# Patient Record
Sex: Female | Born: 1935 | Race: White | Hispanic: No | Smoking: Never smoker
Health system: Southern US, Community
[De-identification: ages and names within clinical notes are randomized; demographics above are authoritative.]

## PROBLEM LIST (undated history)

## (undated) DIAGNOSIS — M1990 Unspecified osteoarthritis, unspecified site: Secondary | ICD-10-CM

## (undated) DIAGNOSIS — H543 Unqualified visual loss, both eyes: Secondary | ICD-10-CM

## (undated) DIAGNOSIS — R51 Headache: Secondary | ICD-10-CM

## (undated) DIAGNOSIS — K219 Gastro-esophageal reflux disease without esophagitis: Secondary | ICD-10-CM

## (undated) DIAGNOSIS — G8929 Other chronic pain: Secondary | ICD-10-CM

## (undated) DIAGNOSIS — I10 Essential (primary) hypertension: Secondary | ICD-10-CM

## (undated) DIAGNOSIS — I209 Angina pectoris, unspecified: Secondary | ICD-10-CM

## (undated) DIAGNOSIS — N200 Calculus of kidney: Secondary | ICD-10-CM

## (undated) DIAGNOSIS — I2510 Atherosclerotic heart disease of native coronary artery without angina pectoris: Secondary | ICD-10-CM

## (undated) DIAGNOSIS — M545 Low back pain, unspecified: Secondary | ICD-10-CM

## (undated) DIAGNOSIS — R011 Cardiac murmur, unspecified: Secondary | ICD-10-CM

## (undated) DIAGNOSIS — H02059 Trichiasis without entropian unspecified eye, unspecified eyelid: Secondary | ICD-10-CM

## (undated) DIAGNOSIS — Z8719 Personal history of other diseases of the digestive system: Secondary | ICD-10-CM

## (undated) DIAGNOSIS — E7800 Pure hypercholesterolemia, unspecified: Secondary | ICD-10-CM

## (undated) DIAGNOSIS — R519 Headache, unspecified: Secondary | ICD-10-CM

## (undated) DIAGNOSIS — H9190 Unspecified hearing loss, unspecified ear: Secondary | ICD-10-CM

## (undated) DIAGNOSIS — J189 Pneumonia, unspecified organism: Secondary | ICD-10-CM

## (undated) HISTORY — PX: OTHER SURGICAL HISTORY: SHX169

## (undated) HISTORY — PX: EYE SURGERY: SHX253

## (undated) HISTORY — PX: ENTROPIAN REPAIR: SHX1512

## (undated) HISTORY — PX: CORONARY ANGIOPLASTY WITH STENT PLACEMENT: SHX49

## (undated) HISTORY — PX: DILATION AND CURETTAGE OF UTERUS: SHX78

---

## 1969-07-11 HISTORY — PX: KIDNEY STONE SURGERY: SHX686

## 1998-02-27 ENCOUNTER — Emergency Department (HOSPITAL_COMMUNITY): Admission: EM | Admit: 1998-02-27 | Discharge: 1998-02-27 | Payer: Self-pay | Admitting: Emergency Medicine

## 1998-04-26 ENCOUNTER — Ambulatory Visit (HOSPITAL_COMMUNITY): Admission: RE | Admit: 1998-04-26 | Discharge: 1998-04-26 | Payer: Self-pay | Admitting: Cardiology

## 1998-05-04 ENCOUNTER — Ambulatory Visit (HOSPITAL_COMMUNITY): Admission: RE | Admit: 1998-05-04 | Discharge: 1998-05-04 | Payer: Self-pay | Admitting: Cardiology

## 1998-09-30 ENCOUNTER — Emergency Department (HOSPITAL_COMMUNITY): Admission: EM | Admit: 1998-09-30 | Discharge: 1998-09-30 | Payer: Self-pay | Admitting: Emergency Medicine

## 1999-07-12 HISTORY — PX: EYE SURGERY: SHX253

## 1999-07-12 HISTORY — PX: CATARACT EXTRACTION W/ INTRAOCULAR LENS IMPLANT: SHX1309

## 2000-01-29 ENCOUNTER — Encounter: Payer: Self-pay | Admitting: Internal Medicine

## 2000-01-29 ENCOUNTER — Encounter: Admission: RE | Admit: 2000-01-29 | Discharge: 2000-01-29 | Payer: Self-pay | Admitting: Internal Medicine

## 2002-04-11 ENCOUNTER — Encounter: Payer: Self-pay | Admitting: Cardiology

## 2002-04-11 ENCOUNTER — Inpatient Hospital Stay (HOSPITAL_COMMUNITY): Admission: EM | Admit: 2002-04-11 | Discharge: 2002-04-15 | Payer: Self-pay | Admitting: Emergency Medicine

## 2002-04-11 ENCOUNTER — Encounter: Payer: Self-pay | Admitting: Emergency Medicine

## 2003-11-06 HISTORY — PX: ECTROPION REPAIR: SHX357

## 2004-01-01 HISTORY — PX: ENTROPIAN REPAIR: SHX1512

## 2004-01-07 ENCOUNTER — Emergency Department (HOSPITAL_COMMUNITY): Admission: EM | Admit: 2004-01-07 | Discharge: 2004-01-07 | Payer: Self-pay | Admitting: Emergency Medicine

## 2005-01-19 ENCOUNTER — Emergency Department (HOSPITAL_COMMUNITY): Admission: EM | Admit: 2005-01-19 | Discharge: 2005-01-19 | Payer: Self-pay | Admitting: Emergency Medicine

## 2005-06-17 ENCOUNTER — Inpatient Hospital Stay (HOSPITAL_COMMUNITY): Admission: EM | Admit: 2005-06-17 | Discharge: 2005-06-23 | Payer: Self-pay | Admitting: Emergency Medicine

## 2007-12-27 ENCOUNTER — Ambulatory Visit (HOSPITAL_COMMUNITY): Admission: EM | Admit: 2007-12-27 | Discharge: 2007-12-27 | Payer: Self-pay | Admitting: Emergency Medicine

## 2008-03-09 ENCOUNTER — Encounter: Admission: RE | Admit: 2008-03-09 | Discharge: 2008-03-09 | Payer: Self-pay | Admitting: Cardiology

## 2008-09-12 ENCOUNTER — Emergency Department (HOSPITAL_COMMUNITY): Admission: EM | Admit: 2008-09-12 | Discharge: 2008-09-13 | Payer: Self-pay | Admitting: Emergency Medicine

## 2008-12-18 ENCOUNTER — Encounter: Admission: RE | Admit: 2008-12-18 | Discharge: 2009-01-25 | Payer: Self-pay | Admitting: Internal Medicine

## 2009-10-07 ENCOUNTER — Inpatient Hospital Stay (HOSPITAL_COMMUNITY): Admission: EM | Admit: 2009-10-07 | Discharge: 2009-10-08 | Payer: Self-pay | Admitting: Emergency Medicine

## 2010-12-05 ENCOUNTER — Encounter (HOSPITAL_COMMUNITY)
Admission: RE | Admit: 2010-12-05 | Discharge: 2010-12-10 | Payer: Self-pay | Source: Home / Self Care | Attending: Internal Medicine | Admitting: Internal Medicine

## 2010-12-05 LAB — CBC
HCT: 27 % — ABNORMAL LOW (ref 36.0–46.0)
Hemoglobin: 8.6 g/dL — ABNORMAL LOW (ref 12.0–15.0)
MCH: 25.1 pg — ABNORMAL LOW (ref 26.0–34.0)
MCHC: 31.9 g/dL (ref 30.0–36.0)
RBC: 3.42 MIL/uL — ABNORMAL LOW (ref 3.87–5.11)
WBC: 4.7 10*3/uL (ref 4.0–10.5)

## 2010-12-19 ENCOUNTER — Other Ambulatory Visit: Payer: Self-pay | Admitting: Internal Medicine

## 2010-12-19 ENCOUNTER — Encounter (HOSPITAL_COMMUNITY): Payer: Medicare Other | Attending: Internal Medicine

## 2010-12-19 DIAGNOSIS — D649 Anemia, unspecified: Secondary | ICD-10-CM | POA: Insufficient documentation

## 2010-12-19 LAB — CBC
HCT: 31.3 % — ABNORMAL LOW (ref 36.0–46.0)
MCHC: 31.9 g/dL (ref 30.0–36.0)
Platelets: 270 10*3/uL (ref 150–400)
RBC: 3.65 MIL/uL — ABNORMAL LOW (ref 3.87–5.11)
RDW: 22.6 % — ABNORMAL HIGH (ref 11.5–15.5)

## 2011-01-02 ENCOUNTER — Encounter (HOSPITAL_COMMUNITY): Payer: Medicare Other

## 2011-01-02 ENCOUNTER — Other Ambulatory Visit: Payer: Self-pay | Admitting: Internal Medicine

## 2011-01-02 LAB — CBC
HCT: 33.6 % — ABNORMAL LOW (ref 36.0–46.0)
Hemoglobin: 10.4 g/dL — ABNORMAL LOW (ref 12.0–15.0)
MCH: 27.4 pg (ref 26.0–34.0)

## 2011-02-12 LAB — CBC
HCT: 33.3 % — ABNORMAL LOW (ref 36.0–46.0)
MCHC: 34 g/dL (ref 30.0–36.0)
Platelets: 242 10*3/uL (ref 150–400)

## 2011-02-12 LAB — URINALYSIS, ROUTINE W REFLEX MICROSCOPIC
Glucose, UA: NEGATIVE mg/dL
Hgb urine dipstick: NEGATIVE
Nitrite: NEGATIVE
pH: 6 (ref 5.0–8.0)

## 2011-02-12 LAB — URINE MICROSCOPIC-ADD ON

## 2011-02-12 LAB — BASIC METABOLIC PANEL
Chloride: 100 mEq/L (ref 96–112)
GFR calc Af Amer: 35 mL/min — ABNORMAL LOW (ref >60)
GFR calc non Af Amer: 29 mL/min — ABNORMAL LOW (ref >60)
Glucose, Bld: 123 mg/dL — ABNORMAL HIGH (ref 70–99)
Potassium: 4.4 mEq/L (ref 3.5–5.1)

## 2011-02-12 LAB — DIFFERENTIAL
Basophils Relative: 1 % (ref 0–1)
Eosinophils Absolute: 0.1 10*3/uL (ref 0.0–0.7)
Eosinophils Relative: 1 % (ref 0–5)
Lymphocytes Relative: 14 % (ref 12–46)
Monocytes Absolute: 0.6 10*3/uL (ref 0.1–1.0)
Neutrophils Relative %: 75 % (ref 43–77)

## 2011-03-28 NOTE — H&P (Signed)
NAME:  Victoria Juarez, Victoria Juarez                 ACCOUNT NO.:  [id number]   MEDICAL RECORD NO.:  [id number]          PATIENT TYPE:  INP   LOCATION:  1826                         FACILITY:  [facility]   PHYSICIAN:  Mark A. Perini, M.D.   DATE OF BIRTH:  June 11, 1915   DATE OF ADMISSION:  06/17/2005  DATE OF DISCHARGE:                                HISTORY & PHYSICAL   PRIMARY CARE PHYSICIAN:  Geoffry Paradise, M.D.   CARDIOLOGY CONSULTANT:  Georga Hacking, M.D.   PAIN MANAGEMENT PHYSICIAN:  Richard D. Ethelene Hal, M.D.   CHIEF COMPLAINT:  Chest pain.   HISTORY OF THE PRESENT ILLNESS:  Victoria Juarez is an 77 year old female with a  past medical history as listed below.  She had a fall seven to 10 days ago  with no apparent fracture; however, with increased pain superimposed on her  diffuse severe chronic pain issues.  Due to this she has been immobilized  and has had notable increased lower extremity edema bilaterally.  She has  some degree of chest pain everyday and today she had an episode at around  noon of a marked increase in chest pain radiating through to her back, and  down her shoulders and arms.  She was brought to the emergency room where  she was found to have an unremarkable EKG with no new changes as well as  negative enzymes on several serial checks.  Furthermore, she has had a CT  scan of her chest, which is negative  for pulmonary embolism or aortic  dissection.  However, due to her reduced mobility and continued severe pain  in her back as well as some continued chest pain she will be admitted for  further evaluation and treatment.   PAST MEDICAL HISTORY:  1.  Atherosclerotic coronary disease status post previous LAD stent.  She      had a follow up cardiac catheterization in June 2003 which showed her      stent to be open, normal LV function and otherwise mild atherosclerotic      changes.  2.  Hypertension.  3.  Gastroesophageal reflux.  4.  L spine degenerative joint disease and  severe generalized      osteoarthritis.  5.  Hyperlipidemia, but the patient apparently is not tolerant of      medications.  6.  No history of diabetes or stroke.  7.  Clinical osteoporosis, although she is not on treatment currently.  8.  No history of dementia.  9.  Status post kidney stone removal remotely.  10. G 2, P 2, parity status; all normal vaginal deliveries.   ALLERGIES:  CODEINE, FLEXERIL and TALWIN are not tolerated.   MEDICATIONS:  The patient's medications currently are:  1.  Diovan 160 mg daily.  2.  Cardizem CD 300 mg daily.  3.  Aspirin 500 mg three times a day.  4.  Nitroglycerin as needed, but she uses this once every other month per      the daughter.  5.  Ambien 10 mg each evening.  6.  Meclizine as needed.  7.  Flexeril; was recently placed, although there is  note that she cannot      tolerate this.   SOCIAL HISTORY:  The patient is a widow.  She has two daughters; one named  Britta Mccreedy who is at the bedside.  She has no tobacco or alcohol history.  She  lives at [facility] in an independent apartment for the last two years.  Up until the last month she was rather functional per the daughter.   FAMILY HISTORY:  Father died at age 47 of stomach cancer.  Mother died at  age 40 of an MI.   REVIEW OF SYSTEMS:  Victoria Juarez is a Full Code Status.  She had a bad fall  10 days ago roughly.  She did have x-rays, which showed no new fractures.  She had some contusions and bruising, and had marked increase in her overall  pain level, which usual is quite high.  She has had very swollen legs for  the last five days and has been doing much more sitting as opposed to her  normal amount of activity.  She refuses to use a walker.  She denies any  recent fevers.  She has severe chronic constipation issues and her last  bowel movement was three to four days ago.  She did try to use an enema  today and just had some bloody discharge.  She does not normally have blood   from her rectum.  She denies any blood from any other site at this time.  She did have some shortness of breath today noted.  She denies any new  abdominal pain, but she does have some chronic lower abdominal pain.   PHYSICAL EXAMINATION:  VITAL SIGNS:  Temperature 97.8, blood pressure  143/56, pulse 84, respiratory rate 20, and 96% saturation on 2 liters of  oxygen.  GENERAL APPEARANCE:  The patient has 1-2+ nonpitting lower extremity edema  up to the tibia with some venous stasis skin changes noted.  She is  neurologically grossly intact.  She alert and oriented times four.  She is  semisupine in bed and she is no acute distress.  HEAD AND NECK:  There is no JVD.  No icterus.  No pallor.  LUNGS:  The patient has some slight inspiratory crackles throughout both  lung fields on inspirations with no wheezes or rhonchi.  HEART:  The heart is regular rate and rhythm with no significant murmur, rub  or gallop.  ABDOMEN:  The abdomen is soft and nontender with no masses.  She does have  left lower quadrant ventral abdominal wall hernia, which is easily  reducible.  EXTREMITIES:  The patient moves extremities times four.   LABORATORY DATA:  CK/MB, troponin I and myoglobin are essentially normal  times two sets.  Her myoglobin on the second check was 238.  Her white count  is 9.9 with a normal differential, hemoglobin 12.7 and platelet count  215,000.  Sodium 137, potassium 3.4, chloride 104, CO2 23, BUN 19,  creatinine 0.9, and glucose 168.  The pH was 7.419, pCO2 37, bicarb 23 and D-  dimer is elevated at 3.96.  EKG shows normal sinus rhythm with left axis  deviation, premature atrial complex, contraction, inferolateral ST  depression, which is unchanged compared to 2003.  CT of the chest shows no  PE and no aortic dissection.  She does have significant aortic and  atherosclerotic calcification  of the coronary arteries as well as the  aorta.  ASSESSMENT AND  PLAN:  [age]-year-old  female with failure to thrive  and increased pain superimposed on chronic pain in her back as well as an  episode of increased chest pain superimposed on some chronic chest pains.  There is no clear cause at this time; however, the patient's functional  status has clearly declined to the point where she is not mobile.   1.  We will admit her.  2.  We will continue IV access.  3.  We will rule out for myocardial infarction with serial enzymes and      electrocardiograms.  4.  We will obtain a physical therapy and occupational therapy consultation.  5.  The patient may have to move up to assisted living or a brief stay in a      skilled nursing facility after this admission.  6.  We will recheck an L spine x-ray to rule out any new compression      fracture; and, in fact, she may need an MRI if the pain proves to be to      severe.  7.  We will and a proton pump inhibitor for GI protection as the patient      takes large doses of aspirin normally.  8.  We will also placed her on DVT dose Lovenox for DVT prophylaxis.  9.  The patient is a Full Code Status.       MAP/MEDQ  D:  06/17/2005  T:  06/17/2005  Job:  [id number]   cc:   Geoffry Paradise, M.D.  [address]  [place]  [place] [address]  Fax: [phone]   W. Ashley Royalty., M.D.  [address], [address]  [place]  [place] [address]  Fax: [phone]   Caralyn Guile. Ethelene Hal, M.D.  [address]  [place]  [place] [address]  Fax: [phone]
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
## 2011-03-28 NOTE — Consult Note (Signed)
Victoria Juarez, Victoria Juarez                 ACCOUNT NO.:  [id number]   MEDICAL RECORD NO.:  [id number]          PATIENT TYPE:  EMS   LOCATION:  MAJO                         FACILITY:  [facility]   PHYSICIAN:  Georga Hacking, M.D.DATE OF BIRTH:  [birth year]   DATE OF CONSULTATION:  06/17/2005  DATE OF DISCHARGE:                                   CONSULTATION   This 77 year old female was seen in consultation for evaluation of chest  pain.  The patient has a prior history of coronary artery disease with  intermittent pain all through the years.  In November of 1998, she had  recurrent angina, had a NIR stent placed in the mid LAD.  She has continued  to have atypical mid sternal chest pain, bilateral arm pain, arthritic pain  and neck pain since that time but had been able to live alone.  She has been  very difficult to evaluate over the years.  She was hospitalized in June of  2003 with chest pain and had cardiac catheterization at that time showing  40% right coronary artery stenosis, 40-50% LAD stenosis,  widely patent  stent involving the LAD with moderate disease in the mid vessel.  She has  been able to live alone at that time but has been anxious because of the  area she lives in.  Her daughter lives in [place].  She was hospitalized  last summer in [place] with dizziness and an elevated blood pressure spike  and had a fairly involved workup and eventually wore an event monitor that  did not show any severe arrhythmias.  She evidently fell and has been in bed  for the past 2 weeks and has complained of some swollen ankles, pain  involving her leg and severe back pain.  She was taken to see the back  doctor at some point, but has basically been in bed and unable to be up and  about.  Several days ago she had the onset of some sharp chest discomfort in  her mid chest area accompanied with sweating that resolved.  Today she again  had sharp pain accompanied with shortness of breath and sweating  in her mid  chest that radiated somewhat into her back and somewhat into her arms.  It  would occur at some point on the left side, some point on the right side.  She was brought to the Emergency Room where an EKG was unremarkable and  initial cardiac enzymes were negative, a D-dimer was 3.96 which is elevated.  She has some mild shortness of breath at the present time.   PAST HISTORY:  Is remarkable for a longstanding hypertension.  She has a  previous history of iritis.  There is no history of ulcers or diabetes.  She  has had hyperlipidemia in the past but has had difficulty tolerating  cholesterol type medicines in the past.   PREVIOUS SURGERY:  1.  D&C.  2.  Knee surgery.   ALLERGIES:  TO SHRIMP.   She previously has been on aspirin 325 mg daily and eye drops previously.   FAMILY  HISTORY:  Is recorded in old records from June of 2003.   SOCIAL HISTORY:  She is currently a widow and lives alone.  A daughter lives  in the [place] area, but is in the process of moving back to [place] and  was in attendance today.  She is a nonsmoker, does not use alcohol to excess  and is a widow.   REVIEW OF SYSTEMS:  Her weight has been stable.  She has had significant  chronic medical problems.  She has severe pain involving her legs, knees,  episodic stomach pain.  She has constipation that has been a problem for  her.  She complains of numbness involving both legs bilaterally and a  feeling as if her legs are having trouble moving.  She has severe  osteoarthritis and also atypical left arm symptoms present for years.  No  genitourinary symptoms or problems, no history of stroke or TIA.  Other than  as noted above, the remainder of the review of systems is unremarkable.   On examination, she is an elderly woman who is difficult to evaluate.  Her  blood pressure was 131/61, temperature 97.8, pulse was 82, pulse ox was 96%.  ENT:  EOMI, PERLA, CNS clear, fundi not examined, pharynx  negative.  NECK:  Supple without masses, JVD, thyromegaly or bruits.  LUNGS:  Clear bilaterally.  CARDIAC EXAM:  Showed normal S1 and S2.  There was no S3.  ABDOMEN:  Is soft and nontender.  There is tenderness in her chest and there  is tenderness also involving her right leg on motion of the leg.  There is  2+ peripheral edema noted, peripheral pulses are 3+.   A 12-lead EKG shows nonspecific ST and T wave abnormality.   LABORATORY WORK:  Shows a hemoglobin of 12.7, hematocrit 36.3, a white count  of 9900.  Initial cardiac enzymes and point of care enzymes were negative.  ISTAT shows a potassium of 3.4, sodium of 137, chloride of 104.   She is currently on Diovan, Cardizem, aspirin and nitroglycerin.   IMPRESSION:  1.  Chest pain with some atypical features in a patient with longstanding      chest pain.  2.  Coronary artery disease with previous stent to the left anterior      descending, previous moderate three vessel disease with catheterization      in 2003.  3.  Recent fall with elevated D-dimer, rule out pulmonary embolus.  4.  History of dizziness.  5.  Severe arthritis.  6.  Previous history of back pain.  7.  Hypertension.   RECOMMENDATIONS:  The patient does not have an acute MI by EKG.  Initial  cardiac enzymes are negative and her EKG is normal.  I would recommend she  have a CT scan of her chest, obtain serial cardiac enzymes and continue her  medicines at home, anticoagulate after CT scan of chest is done.      Georga Hacking, M.D.  Electronically Signed     WST/MEDQ  D:  06/17/2005  T:  06/17/2005  Job:  [id number]   cc:   Geoffry Paradise, M.D.  [address]  [place]  [place] [address]  Fax: [phone]

## 2011-03-28 NOTE — Cardiovascular Report (Signed)
[place]. [facility]  Patient:    Victoria Juarez, Victoria Juarez Visit Number: [id number] MRN: [id number]          Service Type: MED Location: 3700 3736 01 Attending Physician:  Norman Clay Dictated by:   Darden Palmer., M.D. Proc. Date: 04/13/02 Admit Date:  04/11/2002 Discharge Date: 04/15/2002   CC:         Richard A. Jacky Kindle, M.D.   Cardiac Catheterization  HISTORY:  An 77 year old female with a longstanding history of atypical chest pain who has a known history of coronary disease with stenting of the LAD 5 years ago.  She presented with chest discomfort with some typical and other atypical features and developed ST depression on EKG that was new.  MI was ruled out.  COMMENTS ABOUT PROCEDURE:  The patient tolerated the procedure well without complications and had good hemostasis present at the end of the procedure. Please see the attached catheterization log for the remainder of the details.  HEMODYNAMIC DATA: 1. Aorta post contrast 129/56. 2. LV post contrast 129/9.  ANGIOGRAPHIC DATA:  LEFT VENTRICULOGRAM:  Performed in the 30-degree RAO projection.  The aortic valve appeared normal.  There is moderate mitral annular calcification noted without significant mitral regurgitation.  The left ventricle is normal in size with normal wall motion.  There is normal contractility.  The estimated ejection fraction is 70-80%.  CORONARY ARTERIOGRAPHY:  Coronary arteries arise and distribute normally. There is moderate calcification noted in the right coronary artery and the left coronary artery. 1. The left main coronary artery appears normal.  2. The left anterior descending artery has a 40% proximal stenosis.  There is    an eccentric 40% stenosis prior to the septal perforator.  The previously    stented site is widely patent.  This is followed by an area of moderate    diffuse disease with 30-40% irregularity noted.  3. The circumflex  coronary artery is an enlarged codominant vessel.  A large    first marginal branch arises.  A series of two posterolateral branches    arise and then a large third posterolateral branch is present.  There are    only minimal irregularities in this vessel.  4. The right coronary artery has heavy calcification of the ostium.  There is    a segmental 40% stenosis near the ostium and another 40% stenosis with    irregularities noted.  IMPRESSION: 1. Moderate coronary artery disease predominantly involving the left anterior    descending artery and the right coronary artery.  No severe focal    obstructive stenoses noted. 2. Normal left ventricular function.  RECOMMENDATIONS:  Continued medical therapy. Dictated by:   Darden Palmer., M.D. Attending Physician:  Norman Clay DD:  04/14/02 TD:  04/17/02 Job: [id number]

## 2011-03-28 NOTE — Discharge Summary (Signed)
[place]. [facility]  Patient:    Victoria Juarez, Victoria Juarez Visit Number: [id number] MRN: [id number]          Service Type: MED Location: [id number] Attending Physician:  Norman Clay Dictated by:   Darden Palmer., M.D. Admit Date:  04/11/2002 Discharge Date: 04/15/2002   CC:         Richard A. Jacky Kindle, M.D.   Discharge Summary  FINAL DIAGNOSES: 1. Chest pain of undetermined etiology, myocardial infarction ruled out.    a. Catheterization showing moderate coronary artery disease.  No severe       focal obstructive stenoses noted.  Pain is atypical for angina. 2. Severe generalized arthritis. 3. Anxiety. 4. Hypertensive heart disease. 5. Dyslipidemia not currently being treated.  PROCEDURES:  Cardiac catheterization, CT scan of chest.  HISTORY:  An 77 year old woman admitted to rule out MI and chest pain. Long-standing history of intermittent chest pain over the years.  She had coronary disease diagnosed November 1991.  In November 1998 had recurrent angina and had a 3.0 x 16 mm Nir stent placed in the mid LAD.  She headache periodically had episodic chest pain since that time.  She has also had a typical midsternal chest pain, bilateral arm pain, arthritic pain, and neck pain.  She, however, has been able to live alone.  She is continuing to complain of daily chest and arm pains, however.  She was in her usual state of health, but awoke the morning of admission with a left-sided sharp chest pain different than previous pain she had when she had angina and different from the previous pain when she had stenting.  The pain abated.  The daughter came up, took her out to visit the husbands grave, put flowers on there and she had recurrence of the left sharp pain which was severe and almost took her to her knees.  It was described as sharp and pulsating.  She took two nitroglycerin, noted the pain pulsated and went away after 20 minutes.  It  was uncertain whether there was any immediate relief from nitroglycerin.  She was admitted to rule out an MI.  Please see the previously dictated history and physical for remainder of the details.  HOSPITAL COURSE:  Laboratory data on admission showed a hemoglobin of 14.3, hematocrit 41.2.  Glucose is 144 on a random sample.  Potassium 3.6.  All CPK and troponins were completely normal.  A CT scan of the chest done showed no pulmonary emboli or aortic dissection.  The heart was felt to be mildly enlarged.  There was basically atelectasis.  There is thoracic aortic atherosclerosis of without aneurysm or dissection.  There were no filling defects in the lower extremity.  Serial ECGs showed the evolution of T-wave changes in the anterolateral leads.  She remained very difficult to evaluate in the hospital and continued to complain of chest discomfort which was left-sided and sometimes associated with tenderness.  Because of the new EKG changes and ongoing nature of the chest pain, she was taken to cardiac catheterization laboratory on April 14, 2002.  The left main was normal.  The circumflex had no significant disease.  Right coronary artery 40% stenosis. LAD had a 40-50% proximal stenosis.  The stent was widely patent.  There was a 60% mid vessel stenosis noted.  Her symptoms were not felt to be explained by the presence of coronary disease in her and she continued to complain of atypical chest pain.  It was recommended that she be discharged home, that she be treated for arthritis and other noncardiac causes for pain.  She is discharged in improved continued to continue her Cardizem 325 mg daily, aspirin daily, and eye drops.  She also is to begin Altace 5 mg daily.  We will see her in follow-up in one week and she is to follow up with Dr. Jacky Kindle medically. Dictated by:   Darden Palmer., M.D. Attending Physician:  Norman Clay DD:  04/15/02 TD:  04/18/02 Job:  [id number]

## 2011-03-28 NOTE — Discharge Summary (Signed)
NAME:  Victoria Juarez, Victoria Juarez                 ACCOUNT NO.:  [id number]   MEDICAL RECORD NO.:  [id number]          PATIENT TYPE:  INP   LOCATION:  4715                         FACILITY:  [facility]   PHYSICIAN:  Mark A. Perini, M.D.   DATE OF BIRTH:  Oct 10, 1915   DATE OF ADMISSION:  06/17/2005  DATE OF DISCHARGE:  06/23/2005                                 DISCHARGE SUMMARY   DISCHARGE DIAGNOSES:  1.  Atherosclerotic coronary disease, status post non-Q-wave myocardial      infarction with subsequent percutaneous coronary intervention and stent      to the left anterior descending artery.  2.  Degenerative joint disease and degenerative disk disease of the back.  3.  Previous history of coronary artery disease with previous left anterior      descending  stent.  4.  Hypertension.  5.  Gastroesophageal reflux.  6.  Hyperlipidemia with intolerance of many medications.  7.  Osteoporosis clinically  8.  Status post remote kidney stone removal.   PROCEDURES:  Cardiology consultation and subsequent cardiac catheterization  with the findings of a mild narrowing of the left main coronary artery, an  ostial 40% lesion of the left anterior descending, and an eccentric 85-90%  proximal LAD lesion and a distal 40-50% LAD stenosis. She had a large left  circumflex vessel with a 30% stenosis and right coronary had a proximal 40-  50% stenosis. She subsequently had a stent placed to the LAD.   DISCHARGE MEDICATIONS:  1.  Thiamine 160 mg once daily.  2.  Cardizem CD 3 mg daily.  3.  Aspirin 81 mg daily.  4.  Nitroglycerin as needed.  5.  Ambien 10 mg each evening as needed.  6.  Plavix 75 mg once daily, the length of treatment to be determined by Dr.      Donnie Aho.  7.  Prilosec OTC 20 mg once daily with food.  8.  Xanax 0.25 mg once up to twice daily as needed for nervousness.  9.  Metoprolol 25 mg twice daily.  10. Tylenol 500 mg 1-2 tablets every eight hours as needed.   HISTORY OF PRESENT ILLNESS:  Ms.  Juarez is an 77 year old female with a past  history significant for coronary disease, hypertension, gastroesophageal  reflux, lumbar spine degenerative joint disease and generalized  osteoarthritis as well. She had a fall 7-10 days prior to admission with no  apparent fracture. However, she had increased pain, superimposed on her  diffuse severe chronic pain issues. She had been somewhat immobilized and  developed increased chest pain over the background of her normal every day  chest pain. This new pain was markedly more intense and radiated through to  her back and down her shoulders and arms. She was brought to the emergency  room.  She was found have an unremarkable EKG with no new changes and  negative cardiac enzymes on several serial checks in the emergency room.  Furthermore, a CT scan of her chest showed no evidence of pulmonary embolism  or aortic dissection. However, due to her severe pain and reduced  mobility,  she was admitted for further evaluation.   HOSPITAL COURSE:  Victoria Juarez was admitted to a telemetry bed. Subsequent  cardiac enzymes increased and showed that she had ruled in for a non-Q-wave  myocardial infarction.  Cardiology was consulted, and she subsequently  underwent cardiac catheterization with results as noted above. She tolerated  this very well. She did have a physical therapy and occupational therapy  evaluation, and it was felt that she would be appropriate for returning to  her independent apartment with outpatient occupational therapy and physical  therapy consults. By June 23, 2005, she was deemed stable for discharge  home.   DISCHARGE PHYSICAL EXAMINATION:  VITAL SIGNS:  Temperature 98.4, afebrile,  blood pressure 112/61, pulse 67, RESPIRATORY RATE 20, 96% saturation on room  air.  GENERAL:  She is in no acute distress, standing in the room.  LUNGS:  Clear to auscultation bilaterally.  HEART:  Regular rate and rhythm with a 2/6 murmur at the  left sternal border  in systole  ABDOMEN:  Soft and  nontender.  EXTREMITIES:  There was no significant lower extremity edema.   NOTABLE LABORATORY DATA:  On June 21, 2005, her sodium was 137, potassium  3.6, chloride 109, CO2 22, glucose 103, BUN 14, creatinine 0.8, calcium 8.  On June 18, 1005, the patient's peak CK enzyme was 245 with a peak MB  fraction of 28.9 and a peak troponin of 4.98.  On  June 18, 2005, pea,k  myoglobin was 238.  On June 21, 2005, white count was 6.4, hemoglobin  11.5, platelet count 220,000, and MCV was 96.7.   DISCHARGE INSTRUCTIONS:  Victoria Juarez is to follow a low-fat, low-salt diet.  She is to increase her activity slowly and is to use her walker or cane as  directed by physical therapy. She is to follow up with Dr. Donnie Aho as  directed, and she is to follow up with Dr. Jacky Kindle in two weeks.  She is to  call with any recurrent problems, and she is also to wear compression  stockings on her legs each day while awake.           ______________________________  Redge Gainer. Perini, M.D.     MAP/MEDQ  D:  07/23/2005  T:  07/23/2005  Job:  [id number]   cc:   Georga Hacking, M.D.  [address], [address]  [place]  [place] [address]  Fax: [phone]   Geoffry Paradise, M.D.  [address]  [place]  [place] [address]  Fax: [phone]   Caralyn Guile. Ethelene Hal, M.D.  [address]  [place]  [place] [address]  Fax: [phone]

## 2011-03-28 NOTE — Cardiovascular Report (Signed)
Victoria Juarez, Victoria Juarez                 ACCOUNT NO.:  [id number]   MEDICAL RECORD NO.:  [id number]          PATIENT TYPE:  INP   LOCATION:  6532                         FACILITY:  [facility]   PHYSICIAN:  Georga Hacking, M.D.DATE OF BIRTH:  06/22/1915   DATE OF PROCEDURE:  06/19/2005  DATE OF DISCHARGE:                              CARDIAC CATHETERIZATION   HISTORY:  An 77 year old female presented with prolonged chest pain  consistent with unstable angina and she had elevated CPKs.  Developed T-wave  inversions in the anterior leads.  Catheterization is done to assess risk.   COMMENTS ABOUT PROCEDURE:  The patient was prepped and draped in the usual  manner.  After Xylocaine anesthesia a 6-French sheath was placed in the  femoral artery percutaneously without difficulty on a single anterior needle  wall stick.  Angiograms were made using 6-French catheters.  A 30 mL  ventriculogram was performed.  Following this arrangements were made to do  stenting of the proximal LAD stenosis.  Angiomax was begun achieving an ACT  of greater than 200.  She was administered 3 mg of Plavix.  Angioplasty used  was a 6-French JR3.5 guiding catheter and an HTFJ guidewire that crossed the  lesion easily.  The lesion was pre dilated with a 2.5 x 12 mm Maverick  balloon.  A 3 x 12 mm TAXUS drug-eluting stent was deployed to 12  atmospheres on two balloon inflations in the proximal LAD with an excellent  angiographic result.  She had significant EKG changes during balloon  inflation.  Post dilatation angiograms showed TIMI grade 3 flow and an  excellent angiographic result.  She continued to have some residual  discomfort that was difficult to evaluate, but had no EKG changes and was  watched on the table for an additional 5-10 minutes with good flow and no  difficulty.  She was administered 25 mg of Benadryl at the end of the  procedure for shaking.  She otherwise tolerated the procedure well and the  sheath  was sutured in place and she was returned to the angioplasty care  center in stable condition.   HEMODYNAMIC DATA:  Aorta post contrast 160/64.  LV post contrast 160/13-22.   ANGIOGRAPHIC DATA:  Left ventriculogram:  Performed in the 30 degree RAO  projection.  The aortic valve is normal.  The mitral valve is normal.  The  left ventricle appears normal in size.  Increased trabeculations are noted.  The estimated ejection fraction is 60%.  Coronary arteries arise and  distribute normally.  Calcifications noted involving the proximal coronary  system.  The left main coronary artery has a mild ostial narrowing noted.  Left anterior descending has a 40% ostial LAD stenosis.  There is an  eccentric 85-90% proximal stenosis involving the LAD prior to the previous  stent.  There is diffuse disease involving the mid portion with segmental 30-  50% stenosis in the mid portion in several locations.  The mid vessel is  somewhat small.  The circumflex coronary artery is a large vessel with 30%  narrowing after the first marginal  branch.  There are minimal irregularities  in the vessel otherwise.  The right coronary artery has a proximal 40-50%  stenosis and is a codominant vessel.  Post dilatation angiograms of the LAD  reveal 0% residual stenosis with preservation of two large septal  perforators.  There is moderate disease present still distally that reversed  after nitroglycerin.   IMPRESSION:  1.  Successful stenting of the left anterior descending with stenosis going      from 85-90% to 0%.  2.  Residual disease of moderate severity involving the mid to distal left      anterior descending and the proximal      right coronary artery.  3.  Normal left ventricular function.   RECOMMENDATIONS:  Continue Integrilin.  Continue Plavix.  Follow up CPK and  EKG.      Georga Hacking, M.D.  Electronically Signed     WST/MEDQ  D:  06/19/2005  T:  06/19/2005  Job:  [id number]   cc:   Geoffry Paradise, M.D.  [address]  [place]  [place] [address]  Fax: [phone]

## 2011-08-12 LAB — POCT I-STAT, CHEM 8
BUN: 23
Chloride: 104
Hemoglobin: 13.3
Potassium: 4.3
Sodium: 137
TCO2: 25

## 2011-08-12 LAB — DIFFERENTIAL
Lymphocytes Relative: 20
Monocytes Absolute: 0.7
Monocytes Relative: 13 — ABNORMAL HIGH
Neutro Abs: 3.7
Neutrophils Relative %: 65

## 2011-08-12 LAB — CBC: HCT: 38

## 2011-08-12 LAB — POCT CARDIAC MARKERS: CKMB, poc: <1 — ABNORMAL LOW

## 2011-08-12 LAB — D-DIMER, QUANTITATIVE: D-Dimer, Quant: 0.89 — ABNORMAL HIGH

## 2013-02-19 ENCOUNTER — Encounter (HOSPITAL_COMMUNITY): Payer: Self-pay | Admitting: Emergency Medicine

## 2013-02-19 ENCOUNTER — Inpatient Hospital Stay (HOSPITAL_COMMUNITY): Payer: Medicare Other

## 2013-02-19 ENCOUNTER — Inpatient Hospital Stay (HOSPITAL_COMMUNITY)
Admission: EM | Admit: 2013-02-19 | Discharge: 2013-02-21 | DRG: 185 | Disposition: A | Discharge status: Skilled Nursing Facility | Payer: Medicare Other | Attending: Internal Medicine | Admitting: Internal Medicine

## 2013-02-19 ENCOUNTER — Emergency Department (HOSPITAL_COMMUNITY): Payer: Medicare Other

## 2013-02-19 DIAGNOSIS — K219 Gastro-esophageal reflux disease without esophagitis: Secondary | ICD-10-CM | POA: Diagnosis present

## 2013-02-19 DIAGNOSIS — I8310 Varicose veins of unspecified lower extremity with inflammation: Secondary | ICD-10-CM | POA: Diagnosis present

## 2013-02-19 DIAGNOSIS — Z66 Do not resuscitate: Secondary | ICD-10-CM | POA: Diagnosis present

## 2013-02-19 DIAGNOSIS — I10 Essential (primary) hypertension: Secondary | ICD-10-CM | POA: Diagnosis present

## 2013-02-19 DIAGNOSIS — S2249XA Multiple fractures of ribs, unspecified side, initial encounter for closed fracture: Principal | ICD-10-CM | POA: Diagnosis present

## 2013-02-19 DIAGNOSIS — H9190 Unspecified hearing loss, unspecified ear: Secondary | ICD-10-CM | POA: Diagnosis present

## 2013-02-19 DIAGNOSIS — I872 Venous insufficiency (chronic) (peripheral): Secondary | ICD-10-CM | POA: Diagnosis present

## 2013-02-19 DIAGNOSIS — W010XXA Fall on same level from slipping, tripping and stumbling without subsequent striking against object, initial encounter: Secondary | ICD-10-CM | POA: Diagnosis present

## 2013-02-19 DIAGNOSIS — S2241XA Multiple fractures of ribs, right side, initial encounter for closed fracture: Secondary | ICD-10-CM

## 2013-02-19 DIAGNOSIS — M25569 Pain in unspecified knee: Secondary | ICD-10-CM | POA: Diagnosis present

## 2013-02-19 DIAGNOSIS — S2231XA Fracture of one rib, right side, initial encounter for closed fracture: Secondary | ICD-10-CM

## 2013-02-19 DIAGNOSIS — Y9210 Unspecified residential institution as the place of occurrence of the external cause: Secondary | ICD-10-CM | POA: Diagnosis present

## 2013-02-19 DIAGNOSIS — F419 Anxiety disorder, unspecified: Secondary | ICD-10-CM | POA: Diagnosis present

## 2013-02-19 DIAGNOSIS — I2510 Atherosclerotic heart disease of native coronary artery without angina pectoris: Secondary | ICD-10-CM | POA: Diagnosis present

## 2013-02-19 DIAGNOSIS — F411 Generalized anxiety disorder: Secondary | ICD-10-CM | POA: Diagnosis present

## 2013-02-19 HISTORY — DX: Unspecified hearing loss, unspecified ear: H91.90

## 2013-02-19 HISTORY — DX: Essential (primary) hypertension: I10

## 2013-02-19 HISTORY — DX: Unspecified osteoarthritis, unspecified site: M19.90

## 2013-02-19 LAB — CBC WITH DIFFERENTIAL/PLATELET
Basophils Absolute: 0 10*3/uL (ref 0.0–0.1)
HCT: 38.7 % (ref 36.0–46.0)
Hemoglobin: 12.9 g/dL (ref 12.0–15.0)
Lymphocytes Relative: 12 % (ref 12–46)
Lymphs Abs: 0.8 10*3/uL (ref 0.7–4.0)
Monocytes Absolute: 0.8 10*3/uL (ref 0.1–1.0)
Monocytes Relative: 13 % — ABNORMAL HIGH (ref 3–12)
Neutro Abs: 4.6 10*3/uL (ref 1.7–7.7)
RBC: 4.23 MIL/uL (ref 3.87–5.11)
RDW: 13.8 % (ref 11.5–15.5)
WBC: 6.4 10*3/uL (ref 4.0–10.5)

## 2013-02-19 LAB — BASIC METABOLIC PANEL
CO2: 28 mEq/L (ref 19–32)
Chloride: 98 mEq/L (ref 96–112)
Creatinine, Ser: 1 mg/dL (ref 0.50–1.10)
Glucose, Bld: 98 mg/dL (ref 70–99)

## 2013-02-19 MED ORDER — TRIAMTERENE-HCTZ 37.5-25 MG PO TABS
1.0000 | ORAL_TABLET | Freq: Every day | ORAL | Status: DC
  Administered 2013-02-19 – 2013-02-21 (×3): 1 via ORAL
  Filled 2013-02-19 (×3): qty 1

## 2013-02-19 MED ORDER — REFRESH P.M. OP OINT: 1.0000 "application " | TOPICAL_OINTMENT | Freq: Every day | OPHTHALMIC | Status: DC

## 2013-02-19 MED ORDER — IRBESARTAN 150 MG PO TABS
150.0000 mg | ORAL_TABLET | Freq: Every day | ORAL | Status: DC
  Administered 2013-02-19 – 2013-02-21 (×3): 150 mg via ORAL
  Filled 2013-02-19 (×3): qty 1

## 2013-02-19 MED ORDER — TRAMADOL HCL 50 MG PO TABS
50.0000 mg | ORAL_TABLET | Freq: Four times a day (QID) | ORAL | Status: DC | PRN
  Administered 2013-02-19 – 2013-02-21 (×7): 50 mg via ORAL
  Filled 2013-02-19 (×7): qty 1

## 2013-02-19 MED ORDER — SODIUM CHLORIDE 0.9 % IJ SOLN
3.0000 mL | Freq: Two times a day (BID) | INTRAMUSCULAR | Status: DC
  Administered 2013-02-19 – 2013-02-20 (×4): 3 mL via INTRAVENOUS

## 2013-02-19 MED ORDER — DILTIAZEM HCL ER BEADS 300 MG PO CP24
300.0000 mg | ORAL_CAPSULE | Freq: Every day | ORAL | Status: DC
  Administered 2013-02-19 – 2013-02-21 (×3): 300 mg via ORAL
  Filled 2013-02-19 (×3): qty 1

## 2013-02-19 MED ORDER — CALCITONIN (SALMON) 200 UNIT/ACT NA SOLN
1.0000 | Freq: Every day | NASAL | Status: DC
  Administered 2013-02-19 – 2013-02-21 (×3): 1 via NASAL
  Filled 2013-02-19: qty 3.7

## 2013-02-19 MED ORDER — ARTIFICIAL TEARS OP OINT
TOPICAL_OINTMENT | Freq: Every day | OPHTHALMIC | Status: DC
  Administered 2013-02-19 – 2013-02-20 (×2): via OPHTHALMIC
  Filled 2013-02-19: qty 3.5

## 2013-02-19 MED ORDER — SODIUM CHLORIDE 0.9 % IV SOLN: INTRAVENOUS | Status: DC

## 2013-02-19 MED ORDER — POLYVINYL ALCOHOL 1.4 % OP SOLN
1.0000 [drp] | Freq: Four times a day (QID) | OPHTHALMIC | Status: DC | PRN
  Filled 2013-02-19: qty 15

## 2013-02-19 MED ORDER — FENTANYL CITRATE 0.05 MG/ML IJ SOLN
50.0000 ug | INTRAMUSCULAR | Status: DC | PRN
  Administered 2013-02-19: 50 ug via INTRAVENOUS
  Filled 2013-02-19: qty 2

## 2013-02-19 MED ORDER — ASPIRIN EC 81 MG PO TBEC
81.0000 mg | DELAYED_RELEASE_TABLET | Freq: Every day | ORAL | Status: DC
  Administered 2013-02-19 – 2013-02-21 (×3): 81 mg via ORAL
  Filled 2013-02-19 (×3): qty 1

## 2013-02-19 MED ORDER — ENOXAPARIN SODIUM 30 MG/0.3ML ~~LOC~~ SOLN
30.0000 mg | SUBCUTANEOUS | Status: DC
  Administered 2013-02-19 – 2013-02-21 (×3): 30 mg via SUBCUTANEOUS
  Filled 2013-02-19 (×3): qty 0.3

## 2013-02-19 MED ORDER — CARISOPRODOL 350 MG PO TABS
350.0000 mg | ORAL_TABLET | Freq: Three times a day (TID) | ORAL | Status: DC | PRN
  Administered 2013-02-20 – 2013-02-21 (×3): 350 mg via ORAL
  Filled 2013-02-19 (×3): qty 1

## 2013-02-19 MED ORDER — ISOSORBIDE MONONITRATE ER 30 MG PO TB24
30.0000 mg | ORAL_TABLET | Freq: Every day | ORAL | Status: DC
  Administered 2013-02-19 – 2013-02-21 (×3): 30 mg via ORAL
  Filled 2013-02-19 (×3): qty 1

## 2013-02-19 MED ORDER — PANTOPRAZOLE SODIUM 40 MG PO TBEC
40.0000 mg | DELAYED_RELEASE_TABLET | Freq: Every day | ORAL | Status: DC
  Administered 2013-02-19 – 2013-02-21 (×3): 40 mg via ORAL
  Filled 2013-02-19 (×3): qty 1

## 2013-02-19 MED ORDER — ALPRAZOLAM 0.25 MG PO TABS: 0.2500 mg | ORAL_TABLET | Freq: Every day | ORAL | Status: DC | PRN

## 2013-02-19 MED ORDER — POLYVINYL ALCOHOL 1.4 % OP SOLN
1.0000 [drp] | Freq: Four times a day (QID) | OPHTHALMIC | Status: DC
  Administered 2013-02-19 – 2013-02-21 (×6): 1 [drp] via OPHTHALMIC
  Filled 2013-02-19: qty 15

## 2013-02-19 MED ORDER — SODIUM CHLORIDE 0.9 % IV SOLN
Freq: Once | INTRAVENOUS | Status: AC
  Administered 2013-02-19: 20 mL/h via INTRAVENOUS

## 2013-02-19 MED ORDER — PREDNISOLONE ACETATE 1 % OP SUSP
1.0000 [drp] | Freq: Every day | OPHTHALMIC | Status: DC
  Administered 2013-02-19 – 2013-02-21 (×3): 1 [drp] via OPHTHALMIC
  Filled 2013-02-19: qty 1

## 2013-02-19 MED ORDER — HYPROMELLOSE (GONIOSCOPIC) 2.5 % OP SOLN: 1.0000 [drp] | Freq: Four times a day (QID) | OPHTHALMIC | Status: DC | PRN

## 2013-02-19 MED ORDER — POLYETHYLENE GLYCOL 3350 17 G PO PACK
17.0000 g | PACK | Freq: Every day | ORAL | Status: DC
  Administered 2013-02-19 – 2013-02-21 (×3): 17 g via ORAL

## 2013-02-19 MED ORDER — ENOXAPARIN SODIUM 40 MG/0.4ML ~~LOC~~ SOLN: 40.0000 mg | SUBCUTANEOUS | Status: DC

## 2013-02-19 NOTE — ED Provider Notes (Signed)
History     CSN: [id number]  Arrival date & time 02/19/13  0541   First MD Initiated Contact with Patient 02/19/13 0554      Chief Complaint  Patient presents with  . Rib Injury    (Consider location/radiation/quality/duration/timing/severity/associated sxs/prior treatment) HPI This 79 year old female who had a mechanical fall at her assisted living facility this morning just prior to arrival. When she fell she struck her right ribs. She is having moderate pain in her right lower ribs with pain radiating to her right upper back. This pain is worse with palpation or deep breathing. She denies neck pain or head injury.   No past medical history on file.  No past surgical history on file.  No family history on file.  History  Substance Use Topics  . Smoking status: Not on file  . Smokeless tobacco: Not on file  . Alcohol Use: Not on file    OB History   Grav Para Term Preterm Abortions TAB SAB Ect Mult Living                  Review of Systems  All other systems reviewed and are negative.    Allergies  Review of patient's allergies indicates not on file.  Home Medications  No current outpatient prescriptions on file.  BP 140/66  Pulse 78  Temp(Src) 98.7 F (37.1 C) (Oral)  Resp 18  SpO2 97%  Physical Exam General: Well-developed, well-nourished female in no acute distress; appearance consistent with age of record HENT: normocephalic, atraumatic Eyes: pupils equal round and reactive to light; extraocular muscles intact Neck: supple; no C-spine tenderness Heart: regular rate and rhythm; no murmurs, rubs or gallops Lungs: clear to auscultation bilaterally Chest: Right lower rib tenderness with crepitus palpated Abdomen: soft; nondistended; nontender; bowel sounds present Extremities: Arthritic changes; no edema Neurologic: Awake, alert; motor function intact in all extremities and symmetric; no facial droop; profoundly hard of hearing Skin: Warm and  dry Psychiatric: Normal mood and affect    ED Course  Procedures (including critical care time)     MDM  Nursing notes and vitals signs, including pulse oximetry, reviewed.  Summary of this visit's results, reviewed by myself:  Labs:  No results found for this or any previous visit (from the past 24 hour(s)).  Imaging Studies: Ct Chest Wo Contrast  02/19/2013  **ADDENDUM** CREATED: 02/19/2013 06:27:06  Upon further review, nondisplaced fractures are seen involving the left lateral seventh, eighth, ninth ribs which are visible only on the sagittal MPRs (images 6 and 7).  This was discussed with Dr. Read Drivers by telephone at the 6:30 hours on 02/19/2013.  **END ADDENDUM** SIGNED BY: Christos Mixson A. Eppie Gibson, M.D.   02/19/2013  *RADIOLOGY REPORT*  Clinical Data: Right-sided chest and flank pain following fall. Right chest crepitus.  CT CHEST WITHOUT CONTRAST  Technique:  Multidetector CT imaging of the chest was performed following the standard protocol without IV contrast.  Comparison: 06/17/2005  Findings: Cardiomegaly and coronary artery calcification remains stable.  No evidence of mediastinal hematoma or mass.  No evidence of thoracic aortic aneurysm.  No evidence of pneumothorax or hemothorax.  Bilateral lower lobe scarring shows no significant change.  No evidence of acute infiltrate.  No suspicious pulmonary nodules or masses are identified. Pulmonary hyperinflation again is seen, consistent with COPD.  No evidence of chest wall mass or soft tissue gas.  No acute fractures are identified.  IMPRESSION:  1.  No acute findings. 2.  Stable COPD and bilateral  lower lobe scarring. 3.  Stable cardiomegaly.   Original Report Authenticated By: Myles Rosenthal, M.D.            Hanley Seamen, MD 02/20/13 [phone]

## 2013-02-19 NOTE — Plan of Care (Signed)
Problem: Phase I Progression Outcomes Goal: OOB as tolerated unless otherwise ordered Outcome: Progressing To BSC

## 2013-02-19 NOTE — ED Notes (Signed)
ZOX:[id number]<AV> Expected date:<BR> Expected time:<BR> Means of arrival:<BR> Comments:<BR> EMS/79 yo from SNF-fall-hit right flank/pain on inspiration

## 2013-02-19 NOTE — ED Notes (Signed)
Pt arrived via EMS with a complaint of flank pain following a fall.  Pt complaining of right sided flank pain.  Pt O2 saturation is 97 on room air.

## 2013-02-19 NOTE — H&P (Signed)
PCP:   Geoffry Paradise  Chief Complaint:  Fall with rib fx  HPI: 79 YO WF resident of [facility]. Was up to the bathroom and fell and struck her right ribs on the edge of the tub. No LOC. Immediate pain. Able to get up on knees and to get help. No other recent falls. Currently has both rib pain and interscapular muscle pain. Breathing OK. Chronic constipation under control. Knees are sore. No abd pain. No neuro sxs. Had been doing well prior to this. Cardiac status has been stable  Review of Systems:  Review of Systems - Negative except as above Past Medical History: Past Medical History  Diagnosis Date  . Hypertension   . Hx of heart artery stent, 2006, 2008, LVEF 60% in cath in 2008   . Arthritis   . Hearing loss Past Medical History (reviewed - no changes required): ASCAD, GERD, HTN, Venous Ins., Constipation Anemia with Aranesp in past    Past Surgical History  Procedure Laterality Date  . Renal calculi      cardiac stent x2  D&C Knee Surgery removal of embedded kidney stone 1968, Dr Dannette Barbara  Medications: Prior to Admission medications   Medication Sig Start Date End Date Taking? Authorizing Provider  ALPRAZolam Prudy Feeler) 0.25 MG tablet Take 0.25 mg by mouth daily as needed for sleep or anxiety.   Yes Historical Provider, MD  Artificial Tear Ointment (ARTIFICIAL TEARS) ointment Place 1 application into both eyes at bedtime.   Yes Historical Provider, MD  aspirin EC 81 MG tablet Take 81 mg by mouth daily.   Yes Historical Provider, MD  diltiazem (TIAZAC) 300 MG 24 hr capsule Take 300 mg by mouth daily.   Yes Historical Provider, MD  HORSE CHESTNUT PO Take 1 capsule by mouth at bedtime. For leg pain   Yes Historical Provider, MD  hydroxypropyl methylcellulose (ISOPTO TEARS) 2.5 % ophthalmic solution Place 1 drop into both eyes 4 (four) times daily as needed. For dry eyes   Yes Historical Provider, MD  isosorbide mononitrate (IMDUR) 30 MG 24 hr tablet Take 30 mg by mouth  daily.   Yes Historical Provider, MD  omeprazole (PRILOSEC) 40 MG capsule Take 40 mg by mouth daily.   Yes Historical Provider, MD  polyethylene glycol (MIRALAX / GLYCOLAX) packet Take 17 g by mouth daily.   Yes Historical Provider, MD  prednisoLONE acetate (PRED FORTE) 1 % ophthalmic suspension Place 1 drop into both eyes daily.   Yes Historical Provider, MD  triamterene-hydrochlorothiazide (MAXZIDE-25) 37.5-25 MG per tablet Take 1 tablet by mouth daily.   Yes Historical Provider, MD  valsartan (DIOVAN) 160 MG tablet Take 160 mg by mouth daily.   Yes Historical Provider, MD    Allergies:   Allergies  Allergen Reactions  . Codeine Other (See Comments)    "makes her very sick"  . Novocain (Procaine) Other (See Comments)    Unknown reaction    Social History:  does not have a smoking history on file. She has never used smokeless tobacco. She reports that she does not drink alcohol or use illicit drugs.Widow. Nonsmoker, does not use alcohol to excess.  Family History:  Family History (reviewed - no changes required): Father D 20, Stomach Cancer Mother D 86, MI    Physical Exam: Filed Vitals:   02/19/13 0544 02/19/13 0748 02/19/13 0838  BP: 140/66 135/56 139/74  Pulse: 78 73 75  Temp: 98.7 F (37.1 C)  98.1 F (36.7 C)  TempSrc: Oral  Oral  Resp: 18 18 18   SpO2: 97% 93% 93%   General appearance: elderly female lying on right side. Face symmetric, some eye scarring. Oral membranes moist. No bruising or swelling of face or head. No dental trauma   Throat: lips, mucosa, and tongue normal; teeth and gums normal Neck: no adenopathy, no carotid bruit, no JVD and thyroid not enlarged, symmetric, no tenderness/mass/nodules Resp: clear to auscultation bilaterally. Some splinting noted. No wheeze, no rub Cardio: regular rate and rhythm with aortic sys murmur GI: soft, non-tender; bowel sounds normal; no masses,  no organomegaly Extremities: extremities normal, atraumatic, no cyanosis  or edema Pulses: 2+ and symmetric. 1+ edema Lymph nodes:   no cervical lymphadenopathy Neurologic: Alert and oriented X 3, normal strength and tone. Normal symmetric reflexes. mentating well Paraspinous ms in spasm in mid back     Labs on Admission:   Recent Labs  02/19/13 0638  NA 135  K 3.7  CL 98  CO2 28  GLUCOSE 98  BUN 24*  CREATININE 1.00  CALCIUM 9.2      Recent Labs  02/19/13 0638  WBC 6.4  NEUTROABS 4.6  HGB 12.9  HCT 38.7  MCV 91.5  PLT 220        Radiological Exams on Admission: Ct Chest Wo Contrast  02/19/2013  **ADDENDUM** CREATED: 02/19/2013 06:27:06  Upon further review, nondisplaced fractures are seen involving the left lateral seventh, eighth, ninth ribs which are visible only on the sagittal MPRs (images 6 and 7).  This was discussed with Dr. Read Drivers by telephone at the 6:30 hours on 02/19/2013.  **END ADDENDUM** SIGNED BY: John A. Eppie Gibson, M.D.   02/19/2013  *RADIOLOGY REPORT*  Clinical Data: Right-sided chest and flank pain following fall. Right chest crepitus.  CT CHEST WITHOUT CONTRAST  Technique:  Multidetector CT imaging of the chest was performed following the standard protocol without IV contrast.  Comparison: 06/17/2005  Findings: Cardiomegaly and coronary artery calcification remains stable.  No evidence of mediastinal hematoma or mass.  No evidence of thoracic aortic aneurysm.  No evidence of pneumothorax or hemothorax.  Bilateral lower lobe scarring shows no significant change.  No evidence of acute infiltrate.  No suspicious pulmonary nodules or masses are identified. Pulmonary hyperinflation again is seen, consistent with COPD.  No evidence of chest wall mass or soft tissue gas.  No acute fractures are identified.  IMPRESSION:  1.  No acute findings. 2.  Stable COPD and bilateral lower lobe scarring. 3.  Stable cardiomegaly.   Original Report Authenticated By: Myles Rosenthal, M.D.      Assessment/Plan Principal Problem:   Multiple closed  fractures of ribs of right side: no lung issues. Rx tramadol soma and miacalcin for pain Active Problems:   Coronary atherosclerosis of unspecified type of vessel, native or graft: contineu Rx   Esophageal reflux: continue Rx   Essential hypertension, benign: doing OK   Unspecified venous (peripheral) insufficiency: stable   Varicose veins of lower extremities with inflammation: stable   Anxiety state, unspecified: doing Ok, continue Rx Back pain: check an XR DNR STATUS  Cordie Buening ALAN 02/19/2013, 9:19 AM

## 2013-02-20 LAB — BASIC METABOLIC PANEL
BUN: 18 mg/dL (ref 6–23)
Chloride: 100 mEq/L (ref 96–112)
GFR calc Af Amer: 60 mL/min — ABNORMAL LOW (ref >90)
GFR calc non Af Amer: 52 mL/min — ABNORMAL LOW (ref >90)
Potassium: 3.8 mEq/L (ref 3.5–5.1)
Sodium: 135 mEq/L (ref 135–145)

## 2013-02-20 NOTE — Plan of Care (Signed)
Problem: Phase III Progression Outcomes Goal: Activity at appropriate level-compared to baseline (UP IN CHAIR FOR HEMODIALYSIS)  Outcome: Progressing PT note recommends 24/7 assist at discharge.

## 2013-02-20 NOTE — Progress Notes (Signed)
Subjective: Doing fairly well. Rested fair. Some left medial knee pain. Rib pain continues.  eating well.  Objective: Vital signs in last 24 hours: Temp:  [98.6 F (37 C)-99.3 F (37.4 C)] 98.8 F (37.1 C) (04/13 0510) Pulse Rate:  [63-74] 63 (04/13 0510) Resp:  [16-18] 16 (04/13 0510) BP: (110-144)/(63-70) 125/67 mmHg (04/13 0510) SpO2:  [92 %-96 %] 96 % (04/13 0510)  Intake/Output from previous day: 04/12 0701 - 04/13 0700 In: 360 [P.O.:360] Out: 1750 [Urine:1750] Intake/Output this shift:    Sitting up , in no distress. Lungs still clear. Ht regular a bit distant. abd soft NT. Left knee shows OA changes but no bruising or swelling  Lab Results   Recent Labs  02/19/13 0638  WBC 6.4  RBC 4.23  HGB 12.9  HCT 38.7  MCV 91.5  MCH 30.5  RDW 13.8  PLT 220    Recent Labs  02/19/13 0638 02/20/13 0505  NA 135 135  K 3.7 3.8  CL 98 100  CO2 28 26  GLUCOSE 98 97  BUN 24* 18  CREATININE 1.00 0.91  CALCIUM 9.2 9.3    Studies/Results: Ct Chest Wo Contrast  02/19/2013  **ADDENDUM** CREATED: 02/19/2013 06:27:06  Upon further review, nondisplaced fractures are seen involving the left lateral seventh, eighth, ninth ribs which are visible only on the sagittal MPRs (images 6 and 7).  This was discussed with Dr. Read Drivers by telephone at the 6:30 hours on 02/19/2013.  **END ADDENDUM** SIGNED BY: John A. Eppie Gibson, M.D.   02/19/2013  *RADIOLOGY REPORT*  Clinical Data: Right-sided chest and flank pain following fall. Right chest crepitus.  CT CHEST WITHOUT CONTRAST  Technique:  Multidetector CT imaging of the chest was performed following the standard protocol without IV contrast.  Comparison: 06/17/2005  Findings: Cardiomegaly and coronary artery calcification remains stable.  No evidence of mediastinal hematoma or mass.  No evidence of thoracic aortic aneurysm.  No evidence of pneumothorax or hemothorax.  Bilateral lower lobe scarring shows no significant change.  No evidence of acute  infiltrate.  No suspicious pulmonary nodules or masses are identified. Pulmonary hyperinflation again is seen, consistent with COPD.  No evidence of chest wall mass or soft tissue gas.  No acute fractures are identified.  IMPRESSION:  1.  No acute findings. 2.  Stable COPD and bilateral lower lobe scarring. 3.  Stable cardiomegaly.   Original Report Authenticated By: Myles Rosenthal, M.D.    Ct Thoracic Spine Wo Contrast  02/19/2013  *RADIOLOGY REPORT*  Clinical Data: Back pain status post fall.  Attention T5-T7.  CT THORACIC SPINE WITHOUT CONTRAST  Technique:  Multidetector CT imaging of the thoracic spine was performed without intravenous contrast administration. Multiplanar CT image reconstructions were also generated.  These images were reconstructed from the chest CT performed the same date.  Comparison: Chest CT today and 06/17/2005.  Findings: There is a convex right scoliosis associated with mild multilevel spondylosis, greatest at T8-T9 and T10-T11.  There is no evidence of acute thoracic spine fracture.  The sagittal alignment of the thoracic spine is normal.  There is a grade 1 anterolisthesis at C7-T1.  In addition, there is lower cervical spondylosis with advanced disc space loss at C5-C6 and C6-C7.  There is no high-grade foraminal stenosis or paraspinal hematoma. There is chronic deformity of the right twelfth rib.  Pulmonary and mediastinal findings are dictated separately.  IMPRESSION:  1.  No evidence of acute thoracic spine fracture. 2.  Stable scoliosis and multilevel spondylosis.  Prominent degenerative changes are present in the lower cervical spine with a grade 1 anterolisthesis at C7-T1.   Original Report Authenticated By: Carey Bullocks, M.D.     Scheduled Meds: . artificial tears   Both Eyes QHS  . aspirin EC  81 mg Oral Daily  . calcitonin (salmon)  1 spray Alternating Nares Daily  . diltiazem  300 mg Oral Daily  . enoxaparin (LOVENOX) injection  30 mg Subcutaneous Q24H  .  irbesartan  150 mg Oral Daily  . isosorbide mononitrate  30 mg Oral Daily  . pantoprazole  40 mg Oral Daily  . polyethylene glycol  17 g Oral Daily  . polyvinyl alcohol  1 drop Both Eyes QID  . prednisoLONE acetate  1 drop Both Eyes Daily  . sodium chloride  3 mL Intravenous Q12H  . triamterene-hydrochlorothiazide  1 each Oral Daily   Continuous Infusions:  PRN Meds:ALPRAZolam, carisoprodol, fentaNYL, traMADol  Assessment/Plan:  Multiple closed fractures of ribs of right side: Doing pretty well. Mobilize today Coronary atherosclerosis of unspecified type of vessel, native or graft: contineu Rx  Esophageal reflux: continue Rx  Essential hypertension, benign: doing OK  Unspecified venous (peripheral) insufficiency: stable  Varicose veins of lower extremities with inflammation: stable  Anxiety state, unspecified: doing Ok, continue Rx  Back pain: CT looked OK DNR STATUS    LOS: 1 day   Victoria Juarez 02/20/2013, 10:23 AM

## 2013-02-20 NOTE — Evaluation (Signed)
Physical Therapy Evaluation Patient Details Name: Victoria Juarez MRN: [id number] DOB: 1914/11/15 Today's Date: 02/20/2013 Time: 5784-6962 PT Time Calculation (min): 18 min  PT Assessment / Plan / Recommendation Clinical Impression  Pt admitted after fall at independent living facility resulting in multiple closed fractures of ribs of right side.  Pt would benefit from acute PT services in order to improve independence with transfers and ambulation upon d/c.  Pt states she would like to d/c "home" however will likely need at least min assist and assist for ADLs.      PT Assessment  Patient needs continued PT services    Follow Up Recommendations  Home health PT;Supervision/Assistance - 24 hour    Does the patient have the potential to tolerate intense rehabilitation      Barriers to Discharge        Equipment Recommendations  None recommended by PT    Recommendations for Other Services     Frequency Min 3X/week    Precautions / Restrictions Precautions Precautions: Fall Precaution Comments: R Rib fxs   Pertinent Vitals/Pain Increased R rib pain reported with mobility however pt also complains of L and R "under breast" pain, premedicated per pt, positioned to comfort in supine, ice packs applied to R ribs      Mobility  Bed Mobility Bed Mobility: Supine to Sit;Sit to Supine Supine to Sit: 4: Min guard Sit to Supine: 3: Mod assist Details for Bed Mobility Assistance: min/guard for trunk upon rise due to pt hesitant about pain, pt reports "worst part" is bed mobility, assist for LEs onto bed to assist with pain upon return to supine Transfers Transfers: Sit to Stand;Stand to Sit;Stand Pivot Transfers Sit to Stand: 4: Min assist;With upper extremity assist;From chair/3-in-1;From bed Stand to Sit: 4: Min assist;With upper extremity assist;To chair/3-in-1;To bed Stand Pivot Transfers: 4: Min guard Details for Transfer Assistance: verbal cues for technique, assisted to [facility] with  RW and required assist for hygiene due to rib pain Ambulation/Gait Ambulation/Gait Assistance: 4: Min guard Ambulation Distance (Feet): 60 Feet Assistive device: Rolling walker Ambulation/Gait Assistance Details: limited due to pain, verbal cues for turning with RW Gait Pattern: Step-through pattern;Decreased stride length Gait velocity: decreased    Exercises     PT Diagnosis: Difficulty walking;Acute pain  PT Problem List: Decreased strength;Decreased activity tolerance;Decreased mobility;Decreased balance;Pain PT Treatment Interventions: DME instruction;Gait training;Functional mobility training;Therapeutic activities;Therapeutic exercise;Patient/family education   PT Goals Acute Rehab PT Goals PT Goal Formulation: With patient Time For Goal Achievement: 02/27/13 Potential to Achieve Goals: Good Pt will go Supine/Side to Sit: with supervision PT Goal: Supine/Side to Sit - Progress: Goal set today Pt will go Sit to Supine/Side: with supervision PT Goal: Sit to Supine/Side - Progress: Goal set today Pt will go Sit to Stand: with supervision PT Goal: Sit to Stand - Progress: Goal set today Pt will go Stand to Sit: with supervision PT Goal: Stand to Sit - Progress: Goal set today Pt will Ambulate: 51 - 150 feet;with supervision;with rolling walker PT Goal: Ambulate - Progress: Goal set today  Visit Information  Last PT Received On: 02/20/13 Assistance Needed: +1    Subjective Data  Subjective: Let's go a little more this way.  (pt hesitant about ambulation but once up in hallway happy to move)   Prior Functioning  Home Living Type of Home: Independent living facility Home Adaptive Equipment: Dan Humphreys - four wheeled Prior Function Level of Independence: Independent with assistive device(s) Comments: Pt reports she ambulates to meals  with rollator Communication Communication: No difficulties    Cognition  Cognition Overall Cognitive Status: Appears within functional  limits for tasks assessed/performed Arousal/Alertness: Awake/alert Orientation Level: Appears intact for tasks assessed Behavior During Session: [facility] for tasks performed    Extremity/Trunk Assessment Right Lower Extremity Assessment RLE ROM/Strength/Tone: [facility] for tasks assessed Left Lower Extremity Assessment LLE ROM/Strength/Tone: [facility] for tasks assessed   Balance    End of Session PT - End of Session Activity Tolerance: Patient limited by pain Patient left: in bed;with call bell/phone within reach  GP     [name],KATHrine E 02/20/2013, 4:11 PM Zenovia Jarred, PT, DPT 02/20/2013 Pager: [phone]

## 2013-02-21 MED ORDER — CARISOPRODOL 350 MG PO TABS: 350.0000 mg | ORAL_TABLET | Freq: Three times a day (TID) | ORAL | Status: DC | PRN

## 2013-02-21 MED ORDER — ASPIRIN 81 MG PO TBEC: 81.0000 mg | DELAYED_RELEASE_TABLET | Freq: Every day | ORAL | Status: AC

## 2013-02-21 MED ORDER — ALPRAZOLAM 0.25 MG PO TABS: 0.2500 mg | ORAL_TABLET | Freq: Every day | ORAL | Status: AC | PRN

## 2013-02-21 MED ORDER — CALCITONIN (SALMON) 200 UNIT/ACT NA SOLN: 1.0000 | Freq: Every day | NASAL | Status: AC

## 2013-02-21 NOTE — Progress Notes (Signed)
Clinical Social Work Department BRIEF PSYCHOSOCIAL ASSESSMENT 02/21/2013  Patient:  Victoria Juarez, Victoria Juarez     Account Number:  [id number]     Admit date:  02/19/2013  Clinical Social Worker:  Candie Chroman  Date/Time:  02/21/2013 12:30 PM  Referred by:  Physician  Date Referred:  02/21/2013 Referred for  SNF Placement   Other Referral:   Interview type:  Family Other interview type:    PSYCHOSOCIAL DATA Living Status:  FACILITY Admitted from facility:  [facility] Level of care:  Independent Living Primary support name:  Barbaraann Faster Primary support relationship to patient:  CHILD, ADULT Degree of support available:   supportive    CURRENT CONCERNS Current Concerns  Post-Acute Placement   Other Concerns:    SOCIAL WORK ASSESSMENT / PLAN Pt is an 79 yr old female living at [facility] prior to hospitalization. CSW met with pt / daughter to assist with d/c planning. ST Rehab is required prior to returning to independent living. Pt / daughter have accepted placement at [facility] ( [facility] ) . Pt is unable to use her medicare insurance for placement. She has not had a qualifying hospital stay. Pt / daughter are aware of status and have agreed to pay privately for placement.   Assessment/plan status:  Psychosocial Support/Ongoing Assessment of Needs Other assessment/ plan:   Information/referral to community resources:   Insurance coverage reviewed with daughter by MD / CSW.    PATIENT'S/FAMILY'S RESPONSE TO PLAN OF CARE: Pt / daughter are happy with plans for rehab at [facility] . They are disappointed that insurance will not cover rehab stay.   Cori Razor LCSW [phone]

## 2013-02-21 NOTE — Evaluation (Signed)
Occupational Therapy Evaluation Patient Details Name: Victoria Juarez MRN: [id number] DOB: Dec 29, 1914 Today's Date: 02/21/2013 Time: 4098-1191 OT Time Calculation (min): 27 min  OT Assessment / Plan / Recommendation Clinical Impression  Pt is s/p fall with rib fractures and is currently limited with her ADL and functional mobility due to pain in her rib area. She will benefit from skilled OT services to improve ADL independence.     OT Assessment  Patient needs continued OT Services    Follow Up Recommendations  SNF;Supervision/Assistance - 24 hour    Barriers to Discharge      Equipment Recommendations  3 in 1 bedside comode    Recommendations for Other Services    Frequency  Min 2X/week    Precautions / Restrictions Precautions Precautions: Fall Precaution Comments: R Rib fxs        ADL  Eating/Feeding: Simulated;Independent Where Assessed - Eating/Feeding: Chair Grooming: Simulated;Wash/dry hands;Set up Where Assessed - Grooming: Supported sitting Upper Body Bathing: Simulated;Chest;Right arm;Left arm;Abdomen;Minimal assistance Where Assessed - Upper Body Bathing: Unsupported sitting Lower Body Bathing: Simulated;Minimal assistance Where Assessed - Lower Body Bathing: Supported sit to stand Upper Body Dressing: Performed;Maximal assistance (with camisole and button down shirt due to pain) Where Assessed - Upper Body Dressing: Unsupported sitting Lower Body Dressing: Performed;Moderate assistance (with reacher to don underwear and pull up for balance) Where Assessed - Lower Body Dressing: Supported sit to stand Toilet Transfer: Performed;Minimal assistance Toilet Transfer Method: Surveyor, minerals: Materials engineer and Hygiene: Performed;Moderate assistance Where Assessed - Toileting Clothing Manipulation and Hygiene: Standing Equipment Used: Reacher;Long-handled sponge;Long-handled shoe horn;Rolling walker;Sock  aid ADL Comments: Educated daugther and pt on all AE options. Pt has a reacher but hasnt used it to dress before. Pt did well with donning underwear over feet with reacher today. She is shaky (this was present PTA) so it is difficult to do fine motor tasks like buttoning her shirt.     OT Diagnosis: Generalized weakness;Acute pain  OT Problem List: Decreased strength;Pain;Decreased knowledge of use of DME or AE OT Treatment Interventions: Self-care/ADL training;Therapeutic activities;DME and/or AE instruction;Patient/family education   OT Goals Acute Rehab OT Goals OT Goal Formulation: With patient Time For Goal Achievement: 03/07/13 Potential to Achieve Goals: Good ADL Goals Pt Will Perform Grooming: with supervision;Standing at sink ADL Goal: Grooming - Progress: Goal set today Pt Will Perform Lower Body Bathing: with supervision;with adaptive equipment;Sit to stand from chair;Sit to stand from bed ADL Goal: Lower Body Bathing - Progress: Goal set today Pt Will Perform Lower Body Dressing: with supervision;Sit to stand from chair;Sit to stand from bed;with adaptive equipment ADL Goal: Lower Body Dressing - Progress: Goal set today Pt Will Transfer to Toilet: with supervision;3-in-1;Ambulation ADL Goal: Toilet Transfer - Progress: Goal set today Pt Will Perform Toileting - Clothing Manipulation: with supervision;Standing ADL Goal: Toileting - Clothing Manipulation - Progress: Goal set today  Visit Information  Last OT Received On: 02/21/13 Assistance Needed: +1    Subjective Data  Subjective: I have a reacher to pick up things Patient Stated Goal: agreeable to OT;    Prior Functioning     Home Living Type of Home: Independent living facility Home Adaptive Equipment: Dan Humphreys - four wheeled Additional Comments: Pt planning SNF Prior Function Level of Independence: Independent with assistive device(s) Communication Communication: No difficulties          Vision/Perception     Cognition  Cognition Overall Cognitive Status: Appears within functional limits for tasks  assessed/performed Arousal/Alertness: Awake/alert Behavior During Session: [facility] for tasks performed    Extremity/Trunk Assessment Right Upper Extremity Assessment RUE ROM/Strength/Tone: [facility] for tasks assessed ([place] painful to move due rib pain; pt shaky) Left Upper Extremity Assessment LUE ROM/Strength/Tone: WFL for tasks assessed (pt shaky)     Mobility Transfers Transfers: Sit to Stand;Stand to Sit Sit to Stand: 4: Min assist;With upper extremity assist;From chair/3-in-1 Stand to Sit: 4: Min assist;With upper extremity assist;To chair/3-in-1 Details for Transfer Assistance: verbal cues for hand placement     Exercise     Balance Balance Balance Assessed: Yes Dynamic Standing Balance Dynamic Standing - Level of Assistance: 4: Min assist   End of Session OT - End of Session Equipment Utilized During Treatment: Gait belt Activity Tolerance: Patient tolerated treatment well Patient left: in chair;with call bell/phone within reach;with family/visitor present  GO     Lennox Laity [phone] 02/21/2013, 11:52 AM

## 2013-02-21 NOTE — Discharge Summary (Signed)
DISCHARGE SUMMARY  Victoria Juarez  MR#: [id number]  DOB:Dec 27, 1914  Date of Admission: 02/19/2013 Date of Discharge: 02/21/2013  Attending Physician:Latandra Loureiro A  Patient's PCP:No primary provider on file.  Consults:  none  Discharge Diagnoses: Principal Problem:   Multiple closed fractures of ribs of right side Active Problems:   Coronary atherosclerosis of unspecified type of vessel, native or graft   Esophageal reflux   Essential hypertension, benign   Unspecified venous (peripheral) insufficiency   Varicose veins of lower extremities with inflammation   Anxiety state, unspecified   Discharge Medications:   Medication List    TAKE these medications       ALPRAZolam 0.25 MG tablet  Commonly known as:  XANAX  Take 1 tablet (0.25 mg total) by mouth daily as needed for sleep or anxiety.     ALPRAZolam 0.25 MG tablet  Commonly known as:  XANAX  Take 0.25 mg by mouth daily as needed for sleep or anxiety.     artificial tears ointment  Place 1 application into both eyes at bedtime.     aspirin 81 MG EC tablet  Take 1 tablet (81 mg total) by mouth daily.     aspirin EC 81 MG tablet  Take 81 mg by mouth daily.     calcitonin (salmon) 200 UNIT/ACT nasal spray  Commonly known as:  MIACALCIN/FORTICAL  Place 1 spray into the nose daily.     carisoprodol 350 MG tablet  Commonly known as:  SOMA  Take 1 tablet (350 mg total) by mouth 3 (three) times daily as needed (spasm).     diltiazem 300 MG 24 hr capsule  Commonly known as:  TIAZAC  Take 300 mg by mouth daily.     HORSE CHESTNUT PO  Take 1 capsule by mouth at bedtime. For leg pain     hydroxypropyl methylcellulose 2.5 % ophthalmic solution  Commonly known as:  ISOPTO TEARS  Place 1 drop into both eyes 4 (four) times daily as needed. For dry eyes     hydroxypropyl methylcellulose 2.5 % ophthalmic solution  Commonly known as:  ISOPTO TEARS  Place 1 drop into both eyes 4 (four) times daily as needed (dry  eyes).     isosorbide mononitrate 30 MG 24 hr tablet  Commonly known as:  IMDUR  Take 30 mg by mouth daily.     omeprazole 40 MG capsule  Commonly known as:  PRILOSEC  Take 40 mg by mouth daily.     polyethylene glycol packet  Commonly known as:  MIRALAX / GLYCOLAX  Take 17 g by mouth daily.     prednisoLONE acetate 1 % ophthalmic suspension  Commonly known as:  PRED FORTE  Place 1 drop into both eyes daily.     traMADol 50 MG tablet  Commonly known as:  ULTRAM  Take 50 mg by mouth every 8 (eight) hours as needed for pain.     triamterene-hydrochlorothiazide 37.5-25 MG per tablet  Commonly known as:  MAXZIDE-25  Take 1 tablet by mouth daily.     valsartan 160 MG tablet  Commonly known as:  DIOVAN  Take 160 mg by mouth daily.        Hospital Procedures: Ct Chest Wo Contrast  02/19/2013  **ADDENDUM** CREATED: 02/19/2013 06:27:06  Upon further review, nondisplaced fractures are seen involving the left lateral seventh, eighth, ninth ribs which are visible only on the sagittal MPRs (images 6 and 7).  This was discussed with Dr. Read Drivers by telephone at the  6:30 hours on 02/19/2013.  **END ADDENDUM** SIGNED BY: John A. Eppie Gibson, M.D.   02/19/2013  *RADIOLOGY REPORT*  Clinical Data: Right-sided chest and flank pain following fall. Right chest crepitus.  CT CHEST WITHOUT CONTRAST  Technique:  Multidetector CT imaging of the chest was performed following the standard protocol without IV contrast.  Comparison: 06/17/2005  Findings: Cardiomegaly and coronary artery calcification remains stable.  No evidence of mediastinal hematoma or mass.  No evidence of thoracic aortic aneurysm.  No evidence of pneumothorax or hemothorax.  Bilateral lower lobe scarring shows no significant change.  No evidence of acute infiltrate.  No suspicious pulmonary nodules or masses are identified. Pulmonary hyperinflation again is seen, consistent with COPD.  No evidence of chest wall mass or soft tissue gas.  No acute  fractures are identified.  IMPRESSION:  1.  No acute findings. 2.  Stable COPD and bilateral lower lobe scarring. 3.  Stable cardiomegaly.   Original Report Authenticated By: Myles Rosenthal, M.D.    Ct Thoracic Spine Wo Contrast  02/19/2013  *RADIOLOGY REPORT*  Clinical Data: Back pain status post fall.  Attention T5-T7.  CT THORACIC SPINE WITHOUT CONTRAST  Technique:  Multidetector CT imaging of the thoracic spine was performed without intravenous contrast administration. Multiplanar CT image reconstructions were also generated.  These images were reconstructed from the chest CT performed the same date.  Comparison: Chest CT today and 06/17/2005.  Findings: There is a convex right scoliosis associated with mild multilevel spondylosis, greatest at T8-T9 and T10-T11.  There is no evidence of acute thoracic spine fracture.  The sagittal alignment of the thoracic spine is normal.  There is a grade 1 anterolisthesis at C7-T1.  In addition, there is lower cervical spondylosis with advanced disc space loss at C5-C6 and C6-C7.  There is no high-grade foraminal stenosis or paraspinal hematoma. There is chronic deformity of the right twelfth rib.  Pulmonary and mediastinal findings are dictated separately.  IMPRESSION:  1.  No evidence of acute thoracic spine fracture. 2.  Stable scoliosis and multilevel spondylosis.  Prominent degenerative changes are present in the lower cervical spine with a grade 1 anterolisthesis at C7-T1.   Original Report Authenticated By: Carey Bullocks, M.D.     History of Present Illness: 79 YO WF resident of [facility]. Was up to the bathroom and fell and struck her right ribs on the edge of the tub. No LOC. Immediate pain. Able to get up on knees and to get help. No other recent falls. Currently has both rib pain and interscapular muscle pain. Breathing OK. Chronic constipation under control. Knees are sore. No abd pain. No neuro sxs. Had been doing well prior to this. Cardiac status has  been stable   Hospital Course: Patient was unable to be discharged back to independent living was admitted for nursing care and pain control while sorting out further disposition. She was easily managed with a oral analgesics except for some initial IV Dilaudid-HP. She remained comfortable upon moving and is totally ADL dependent that she's unable to get out of bed on without nursing assistance because of these rib fractures. She's had no hemodynamic or pulmonary difficulties. Did emphasize deep breathing. I did discuss by phone with her daughter Victoria Juarez ultimate disposition and Victoria Juarez indicates that she's unable to return while this dependent on staff. She'll be going to skilled nursing today.  Day of Discharge Exam BP 108/58  Pulse 71  Temp(Src) 98.4 F (36.9 C) (Oral)  Resp 16  Ht 4'  11" (1.499 m)  Wt 52.617 kg (116 lb)  BMI 23.42 kg/m2  SpO2 90%  Physical Exam: General appearance: alert and cooperative Eyes: no scleral icterus Throat: oropharynx moist without erythema Resp: clear to auscultation bilaterally Cardio: regular rate and rhythm Extremities: no clubbing, cyanosis or edema Neurologically patient is totally clear and nonlateralizing  Discharge Labs:  Recent Labs  02/19/13 0638 02/20/13 0505  NA 135 135  K 3.7 3.8  CL 98 100  CO2 28 26  GLUCOSE 98 97  BUN 24* 18  CREATININE 1.00 0.91  CALCIUM 9.2 9.3   No results found for this basename: AST, ALT, ALKPHOS, BILITOT, PROT, ALBUMIN,  in the last 72 hours  Recent Labs  02/19/13 0638  WBC 6.4  NEUTROABS 4.6  HGB 12.9  HCT 38.7  MCV 91.5  PLT 220   No results found for this basename: CKTOTAL, CKMB, CKMBINDEX, TROPONINI,  in the last 72 hours No results found for this basename: TSH, T4TOTAL, FREET3, T3FREE, THYROIDAB,  in the last 72 hours No results found for this basename: VITAMINB12, FOLATE, FERRITIN, TIBC, IRON, RETICCTPCT,  in the last 72 hours  Discharge instructions:     Discharge Orders   Future  Orders Complete By Expires     Diet - low sodium heart healthy  As directed     Increase activity slowly  As directed        Disposition: Skilled nursing  Follow-up Appts: Follow-up with Dr. Jacky Kindle at [facility] in within 2 weeks of discharge from skilled nursing until and with the facility physician. She has been offered either [facility] or [facility] where [facility] attends but this is totally up to she and her family they may certainly choose anywhere they would like.  Call for appointment.  Condition on Discharge: Stable  Tests Needing Follow-up: None, patient is a no CODE BLUE  Signed: Haizel Gatchell A 02/21/2013, 7:38 AM

## 2013-02-21 NOTE — Progress Notes (Signed)
Clinical Social Work Department CLINICAL SOCIAL WORK PLACEMENT NOTE 02/21/2013  Patient:  Victoria Juarez, Victoria Juarez  Account Number:  [id number] Admit date:  02/19/2013  Clinical Social Worker:  Cori Razor, LCSW  Date/time:  02/21/2013 12:30 PM  Clinical Social Work is seeking post-discharge placement for this patient at the following level of care:   SKILLED NURSING   (*CSW will update this form in Epic as items are completed)     Patient/family provided with [facility] Department of Clinical Social Work's list of facilities offering this level of care within the geographic area requested by the patient (or if unable, by the patient's family).    Patient/family informed of their freedom to choose among providers that offer the needed level of care, that participate in Medicare, Medicaid or managed care program needed by the patient, have an available bed and are willing to accept the patient.    Patient/family informed of [facility]' ownership interest in [facility], as well as of the fact that they are under no obligation to receive care at this facility.  PASARR submitted to EDS on 02/21/2013 PASARR number received from EDS on 02/21/2013  FL2 transmitted to all facilities in geographic area requested by pt/family on  02/21/2013 FL2 transmitted to all facilities within larger geographic area on   Patient informed that his/her managed care company has contracts with or will negotiate with  certain facilities, including the following:     Patient/family informed of bed offers received:  02/21/2013 Patient chooses bed at [facility], [place] Physician recommends and patient chooses bed at    Patient to be [facility] [place] on  02/21/2013 Patient to be transferred to facility by [facility]  The following physician request were entered in Epic:   Additional Comments:  Cori Razor LCSW [phone]

## 2013-02-21 NOTE — Progress Notes (Signed)
Physical Therapy Treatment Patient Details Name: Victoria Juarez MRN: [id number] DOB: [birth year] Today's Date: 02/21/2013 Time: 1410-1420 PT Time Calculation (min): 10 min  PT Assessment / Plan / Recommendation Comments on Treatment Session  Progressing with mobility. Noted pt planning to d/c to SNF possibly today. Continues to report R sided pain.     Follow Up Recommendations  SNF vs HHPT with 24 hour supervision     Does the patient have the potential to tolerate intense rehabilitation     Barriers to Discharge        Equipment Recommendations  None recommended by PT    Recommendations for Other Services    Frequency Min 3X/week   Plan Discharge plan remains appropriate    Precautions / Restrictions Precautions Precautions: Fall Precaution Comments: R Rib fxs Restrictions Weight Bearing Restrictions: No   Pertinent Vitals/Pain R side ribs 5/10     Mobility  Bed Mobility Bed Mobility: Not assessed Details for Bed Mobility Assistance: pt sitting in recliner Transfers Transfers: Sit to Stand;Stand to Sit Sit to Stand: 4: Min assist;From chair/3-in-1;With armrests Stand to Sit: 4: Min assist;To chair/3-in-1;With armrests Details for Transfer Assistance: VCs safety, hand placement. Assist to rise, stabilize, control descent Ambulation/Gait Ambulation/Gait Assistance: 4: Min guard Ambulation Distance (Feet): 115 Feet Assistive device: Rolling walker Gait Pattern: Step-through pattern;Decreased stride length    Exercises     PT Diagnosis:    PT Problem List:   PT Treatment Interventions:     PT Goals Acute Rehab PT Goals Pt will go Sit to Stand: with supervision PT Goal: Sit to Stand - Progress: Progressing toward goal Pt will go Stand to Sit: with supervision PT Goal: Stand to Sit - Progress: Progressing toward goal Pt will Ambulate: 51 - 150 feet;with supervision;with rolling walker PT Goal: Ambulate - Progress: Goal set today  Visit Information  Last PT  Received On: 02/21/13 Assistance Needed: +1    Subjective Data  Subjective: I think I could do better at home Patient Stated Goal: home   Cognition  Cognition Overall Cognitive Status: Appears within functional limits for tasks assessed/performed Arousal/Alertness: Awake/alert Orientation Level: Appears intact for tasks assessed Behavior During Session: [facility] for tasks performed    Balance  Balance Balance Assessed: Yes Dynamic Standing Balance Dynamic Standing - Level of Assistance: 4: Min assist  End of Session PT - End of Session Equipment Utilized During Treatment: Gait belt Activity Tolerance: Patient tolerated treatment well Patient left: in chair;with call bell/phone within reach   GP     Rebeca Alert, PT Pager: [phone]

## 2014-11-08 ENCOUNTER — Encounter (HOSPITAL_COMMUNITY): Payer: Self-pay

## 2014-11-08 ENCOUNTER — Emergency Department (HOSPITAL_COMMUNITY): Payer: Medicare Other

## 2014-11-08 ENCOUNTER — Inpatient Hospital Stay (HOSPITAL_COMMUNITY)
Admission: EM | Admit: 2014-11-08 | Discharge: 2014-11-10 | DRG: 392 | Disposition: A | Discharge status: Home / Self Care | Payer: Medicare Other | Attending: Internal Medicine | Admitting: Internal Medicine

## 2014-11-08 DIAGNOSIS — Z880 Allergy status to penicillin: Secondary | ICD-10-CM

## 2014-11-08 DIAGNOSIS — I2510 Atherosclerotic heart disease of native coronary artery without angina pectoris: Secondary | ICD-10-CM | POA: Diagnosis present

## 2014-11-08 DIAGNOSIS — R079 Chest pain, unspecified: Secondary | ICD-10-CM | POA: Diagnosis not present

## 2014-11-08 DIAGNOSIS — Z885 Allergy status to narcotic agent status: Secondary | ICD-10-CM

## 2014-11-08 DIAGNOSIS — H543 Unqualified visual loss, both eyes: Secondary | ICD-10-CM | POA: Diagnosis present

## 2014-11-08 DIAGNOSIS — Z66 Do not resuscitate: Secondary | ICD-10-CM | POA: Diagnosis present

## 2014-11-08 DIAGNOSIS — Z955 Presence of coronary angioplasty implant and graft: Secondary | ICD-10-CM

## 2014-11-08 DIAGNOSIS — F0390 Unspecified dementia without behavioral disturbance: Secondary | ICD-10-CM | POA: Diagnosis present

## 2014-11-08 DIAGNOSIS — Z7952 Long term (current) use of systemic steroids: Secondary | ICD-10-CM

## 2014-11-08 DIAGNOSIS — R1084 Generalized abdominal pain: Secondary | ICD-10-CM | POA: Diagnosis present

## 2014-11-08 DIAGNOSIS — R011 Cardiac murmur, unspecified: Secondary | ICD-10-CM | POA: Diagnosis present

## 2014-11-08 DIAGNOSIS — M1990 Unspecified osteoarthritis, unspecified site: Secondary | ICD-10-CM | POA: Diagnosis present

## 2014-11-08 DIAGNOSIS — H547 Unspecified visual loss: Secondary | ICD-10-CM | POA: Diagnosis present

## 2014-11-08 DIAGNOSIS — I10 Essential (primary) hypertension: Secondary | ICD-10-CM | POA: Diagnosis present

## 2014-11-08 DIAGNOSIS — R1033 Periumbilical pain: Secondary | ICD-10-CM

## 2014-11-08 DIAGNOSIS — Z7982 Long term (current) use of aspirin: Secondary | ICD-10-CM

## 2014-11-08 DIAGNOSIS — K219 Gastro-esophageal reflux disease without esophagitis: Secondary | ICD-10-CM | POA: Diagnosis present

## 2014-11-08 DIAGNOSIS — H9190 Unspecified hearing loss, unspecified ear: Secondary | ICD-10-CM | POA: Diagnosis present

## 2014-11-08 DIAGNOSIS — K2970 Gastritis, unspecified, without bleeding: Secondary | ICD-10-CM | POA: Diagnosis not present

## 2014-11-08 DIAGNOSIS — Z87442 Personal history of urinary calculi: Secondary | ICD-10-CM

## 2014-11-08 DIAGNOSIS — G259 Extrapyramidal and movement disorder, unspecified: Secondary | ICD-10-CM | POA: Diagnosis present

## 2014-11-08 DIAGNOSIS — R197 Diarrhea, unspecified: Secondary | ICD-10-CM | POA: Diagnosis present

## 2014-11-08 HISTORY — DX: Calculus of kidney: N20.0

## 2014-11-08 HISTORY — DX: Unqualified visual loss, both eyes: H54.3

## 2014-11-08 LAB — URINALYSIS, ROUTINE W REFLEX MICROSCOPIC
Bilirubin Urine: NEGATIVE
GLUCOSE, UA: NEGATIVE mg/dL
Ketones, ur: NEGATIVE mg/dL
NITRITE: NEGATIVE
PROTEIN: NEGATIVE mg/dL
Specific Gravity, Urine: 1.007 (ref 1.005–1.030)
UROBILINOGEN UA: 1 mg/dL (ref 0.0–1.0)
pH: 6 (ref 5.0–8.0)

## 2014-11-08 LAB — CBC WITH DIFFERENTIAL/PLATELET
BASOS ABS: 0.1 10*3/uL (ref 0.0–0.1)
Basophils Relative: 1 % (ref 0–1)
EOS ABS: 0.2 10*3/uL (ref 0.0–0.7)
EOS PCT: 4 % (ref 0–5)
HCT: 33.2 % — ABNORMAL LOW (ref 36.0–46.0)
Hemoglobin: 10.6 g/dL — ABNORMAL LOW (ref 12.0–15.0)
LYMPHS ABS: 1.3 10*3/uL (ref 0.7–4.0)
Lymphocytes Relative: 28 % (ref 12–46)
MCH: 28.6 pg (ref 26.0–34.0)
MCHC: 31.9 g/dL (ref 30.0–36.0)
MCV: 89.7 fL (ref 78.0–100.0)
Monocytes Absolute: 0.5 10*3/uL (ref 0.1–1.0)
Monocytes Relative: 11 % (ref 3–12)
Neutro Abs: 2.7 10*3/uL (ref 1.7–7.7)
Neutrophils Relative %: 56 % (ref 43–77)
PLATELETS: 253 10*3/uL (ref 150–400)
RBC: 3.7 MIL/uL — ABNORMAL LOW (ref 3.87–5.11)
RDW: 14.5 % (ref 11.5–15.5)
WBC: 4.8 10*3/uL (ref 4.0–10.5)

## 2014-11-08 LAB — I-STAT TROPONIN, ED: TROPONIN I, POC: 0.01 ng/mL (ref 0.00–0.08)

## 2014-11-08 LAB — COMPREHENSIVE METABOLIC PANEL
ALT: 17 U/L (ref 0–35)
AST: 24 U/L (ref 0–37)
Albumin: 3.5 g/dL (ref 3.5–5.2)
Alkaline Phosphatase: 122 U/L — ABNORMAL HIGH (ref 39–117)
Anion gap: 9 (ref 5–15)
BUN: 28 mg/dL — ABNORMAL HIGH (ref 6–23)
CALCIUM: 8.9 mg/dL (ref 8.4–10.5)
CO2: 23 mmol/L (ref 19–32)
CREATININE: 1.2 mg/dL — AB (ref 0.50–1.10)
Chloride: 104 mEq/L (ref 96–112)
GFR calc non Af Amer: 37 mL/min — ABNORMAL LOW (ref >90)
GFR, EST AFRICAN AMERICAN: 42 mL/min — AB (ref >90)
GLUCOSE: 106 mg/dL — AB (ref 70–99)
Potassium: 3.9 mmol/L (ref 3.5–5.1)
SODIUM: 136 mmol/L (ref 135–145)
TOTAL PROTEIN: 6.1 g/dL (ref 6.0–8.3)
Total Bilirubin: 0.2 mg/dL — ABNORMAL LOW (ref 0.3–1.2)

## 2014-11-08 LAB — LIPASE, BLOOD: LIPASE: 27 U/L (ref 11–59)

## 2014-11-08 LAB — D-DIMER, QUANTITATIVE (NOT AT ARMC): D-Dimer, Quant: 0.68 ug/mL-FEU — ABNORMAL HIGH (ref 0.00–0.48)

## 2014-11-08 LAB — URINE MICROSCOPIC-ADD ON

## 2014-11-08 LAB — I-STAT CG4 LACTIC ACID, ED: Lactic Acid, Venous: 1.42 mmol/L (ref 0.5–2.2)

## 2014-11-08 MED ORDER — IOHEXOL 350 MG/ML SOLN
80.0000 mL | Freq: Once | INTRAVENOUS | Status: AC | PRN
  Administered 2014-11-08: 80 mL via INTRAVENOUS

## 2014-11-08 MED ORDER — TRAMADOL HCL 50 MG PO TABS
50.0000 mg | ORAL_TABLET | Freq: Once | ORAL | Status: AC
  Administered 2014-11-08: 50 mg via ORAL
  Filled 2014-11-08: qty 1

## 2014-11-08 MED ORDER — IOHEXOL 300 MG/ML  SOLN
25.0000 mL | INTRAMUSCULAR | Status: AC
Start: 2014-11-08 — End: 2014-11-08
  Administered 2014-11-08 (×2): 25 mL via ORAL

## 2014-11-08 MED ORDER — FENTANYL CITRATE 0.05 MG/ML IJ SOLN
50.0000 ug | Freq: Once | INTRAMUSCULAR | Status: AC
  Administered 2014-11-08: 50 ug via INTRAVENOUS
  Filled 2014-11-08: qty 2

## 2014-11-08 MED ORDER — ASPIRIN 325 MG PO TABS
325.0000 mg | ORAL_TABLET | Freq: Once | ORAL | Status: AC
  Administered 2014-11-08: 325 mg via ORAL
  Filled 2014-11-08: qty 1

## 2014-11-08 NOTE — ED Notes (Signed)
MD Docherty at the bedside.  

## 2014-11-08 NOTE — ED Notes (Signed)
Portable Chest Xray at the bedside.  

## 2014-11-08 NOTE — ED Notes (Signed)
Tramaine, EMT at the bedside.

## 2014-11-08 NOTE — ED Notes (Signed)
CT called and informed pt has finished her contrast.  

## 2014-11-08 NOTE — ED Provider Notes (Signed)
CSN: [id number]     Arrival date & time 11/08/14  1744 History   First MD Initiated Contact with Patient 11/08/14 1808     Chief Complaint  Patient presents with  . Abdominal Pain     (Consider location/radiation/quality/duration/timing/severity/associated sxs/prior Treatment) HPI Comments: *Also reports she has been complaining of left-sided neck pain with radiation down the left arm for 2-3 weeks  Patient is a 80 y.o. female presenting with abdominal pain. The history is provided by the patient.  Abdominal Pain Pain quality: sharp   Pain quality comment:  Started central chest and radiated down to periumbilical area Pain radiates to:  Periumbilical region Pain severity:  Moderate Onset quality:  Sudden Duration:  2 hours Timing:  Constant Progression:  Improving Chronicity:  New Context comment:  After eating dinner Relieved by:  Nothing Worsened by:  Nothing tried Ineffective treatments:  None tried Associated symptoms: chest pain and diarrhea   Associated symptoms: no anorexia, no chills, no constipation, no cough, no dysuria, no fatigue, no fever, no flatus, no nausea, no shortness of breath, no sore throat and no vomiting   Risk factors comment:  Elderly, nursing home resident   Past Medical History  Diagnosis Date  . Hypertension   . Hx of heart artery stent   . Arthritis   . Hearing loss   . Vision loss, bilateral   . Kidney stone     Patient has distention on the right side from scar tissue from a past embedded kidney stone.    Past Surgical History  Procedure Laterality Date  . Renal calculi      cardiac stent x2  . Coronary stent placement     No family history on file. History  Substance Use Topics  . Smoking status: Never Smoker   . Smokeless tobacco: Never Used  . Alcohol Use: No   OB History    No data available     Review of Systems  Constitutional: Negative for fever, chills, diaphoresis, activity change, appetite change and fatigue.   HENT: Negative for congestion, facial swelling, rhinorrhea and sore throat.   Eyes: Negative for photophobia and discharge.  Respiratory: Negative for cough, chest tightness and shortness of breath.   Cardiovascular: Positive for chest pain. Negative for palpitations and leg swelling.  Gastrointestinal: Positive for abdominal pain and diarrhea. Negative for nausea, vomiting, constipation, anorexia and flatus.  Endocrine: Negative for polydipsia and polyuria.  Genitourinary: Negative for dysuria, frequency, difficulty urinating and pelvic pain.  Musculoskeletal: Negative for back pain, arthralgias, neck pain and neck stiffness.  Skin: Negative for color change and wound.  Allergic/Immunologic: Negative for immunocompromised state.  Neurological: Negative for facial asymmetry, weakness, numbness and headaches.  Hematological: Does not bruise/bleed easily.  Psychiatric/Behavioral: Negative for confusion and agitation.      Allergies  Codeine and Novocain  Home Medications   Prior to Admission medications   Medication Sig Start Date End Date Taking? Authorizing Provider  ALPRAZolam Prudy Feeler) 0.25 MG tablet Take 0.25 mg by mouth daily as needed for sleep or anxiety.    Historical Provider, MD  ALPRAZolam Prudy Feeler) 0.25 MG tablet Take 1 tablet (0.25 mg total) by mouth daily as needed for sleep or anxiety. 02/21/13   Minda Meo, MD  Artificial Tear Ointment (ARTIFICIAL TEARS) ointment Place 1 application into both eyes at bedtime.    Historical Provider, MD  aspirin EC 81 MG EC tablet Take 1 tablet (81 mg total) by mouth daily. 02/21/13   Richard A  Jacky KindleAronson, MD  aspirin EC 81 MG tablet Take 81 mg by mouth daily.    Historical Provider, MD  calcitonin, salmon, (MIACALCIN/FORTICAL) 200 UNIT/ACT nasal spray Place 1 spray into the nose daily. 02/21/13   Minda Meoichard A Aronson, MD  carisoprodol (SOMA) 350 MG tablet Take 1 tablet (350 mg total) by mouth 3 (three) times daily as needed (spasm). 02/21/13    Minda Meoichard A Aronson, MD  diltiazem (TIAZAC) 300 MG 24 hr capsule Take 300 mg by mouth daily.    Historical Provider, MD  escitalopram (LEXAPRO) 10 MG tablet Take 10 mg by mouth daily. 10/13/14   Historical Provider, MD  HORSE CHESTNUT PO Take 1 capsule by mouth at bedtime. For leg pain    Historical Provider, MD  hydroxypropyl methylcellulose (ISOPTO TEARS) 2.5 % ophthalmic solution Place 1 drop into both eyes 4 (four) times daily as needed. For dry eyes    Historical Provider, MD  hydroxypropyl methylcellulose (ISOPTO TEARS) 2.5 % ophthalmic solution Place 1 drop into both eyes 4 (four) times daily as needed (dry eyes).    Historical Provider, MD  isosorbide mononitrate (IMDUR) 30 MG 24 hr tablet Take 30 mg by mouth daily.    Historical Provider, MD  losartan (COZAAR) 50 MG tablet Take 50 mg by mouth daily. 09/22/14   Historical Provider, MD  omeprazole (PRILOSEC) 40 MG capsule Take 40 mg by mouth daily.    Historical Provider, MD  polyethylene glycol (MIRALAX / GLYCOLAX) packet Take 17 g by mouth daily.    Historical Provider, MD  prednisoLONE acetate (PRED FORTE) 1 % ophthalmic suspension Place 1 drop into both eyes daily.    Historical Provider, MD  traMADol (ULTRAM) 50 MG tablet Take 50 mg by mouth every 8 (eight) hours as needed for pain.    Historical Provider, MD  triamterene-hydrochlorothiazide (MAXZIDE-25) 37.5-25 MG per tablet Take 1 tablet by mouth daily.    Historical Provider, MD  valsartan (DIOVAN) 160 MG tablet Take 160 mg by mouth daily.    Historical Provider, MD   BP 121/57 mmHg  Pulse 70  Resp 18  SpO2 96% Physical Exam  Constitutional: She is oriented to person, place, and time. She appears well-developed and well-nourished. No distress.  HENT:  Head: Normocephalic and atraumatic.  Mouth/Throat: No oropharyngeal exudate.  Eyes: Pupils are equal, round, and reactive to light.  Neck: Normal range of motion. Neck supple.  Cardiovascular: Normal rate, regular rhythm and normal  heart sounds.  Exam reveals no gallop and no friction rub.   No murmur heard. Pulmonary/Chest: Effort normal and breath sounds normal. No respiratory distress. She has no wheezes. She has no rales.  Abdominal: Soft. Bowel sounds are normal. She exhibits no distension and no mass. There is tenderness in the left lower quadrant. There is no rebound and no guarding.  Musculoskeletal: Normal range of motion. She exhibits no edema or tenderness.  Neurological: She is alert and oriented to person, place, and time.  Skin: Skin is warm and dry.  Psychiatric: She has a normal mood and affect.    ED Course  Procedures (including critical care time) Labs Review Labs Reviewed  URINE CULTURE  CLOSTRIDIUM DIFFICILE BY PCR  CBC WITH DIFFERENTIAL  COMPREHENSIVE METABOLIC PANEL  LIPASE, BLOOD  URINALYSIS, ROUTINE W REFLEX MICROSCOPIC  I-STAT TROPOININ, ED  I-STAT CG4 LACTIC ACID, ED    Imaging Review No results found.   EKG Interpretation   Date/Time:  Wednesday November 08 2014 17:56:16 EST Ventricular Rate:  72 PR  Interval:  179 QRS Duration: 97 QT Interval:  438 QTC Calculation: 479 R Axis:   -29 Text Interpretation:  Sinus rhythm Borderline left axis deviation  Anteroseptal infarct, age indeterminate TW flattening in lateral leads  Confirmed by DOCHERTY  MD, MEGAN [phone]) on 11/08/2014 6:01:30 PM      MDM   Final diagnoses:  Chest pain  Periumbilical pain    Pt is a 80 y.o. female with Pmhx as above who presents with sudden onset sharp central chest pain at about 4:30 PM with radiation to periumbilical region.  Patient states she is no longer having chest pain, but is still having periumbilical abdominal pain.  She also reports that she has had multiple episodes of loose, bloody stool since yesterday.  Daughter reports she has a history of daily chest pain.  The patient reporting this pain was different.  Daughter also reports she is a history of abdominal pain infrequently  reports blood in her stool, reports this has been worked up by her PCP and she is currently on iron supplementation.  On physical exam, patient has left lower quadrant abdominal pain without rebound or guarding.  She has a loud blowing systolic murmur which is chronic per daughter, but has been worsening recently.  EKG with T-wave flattening in the lateral leads.  First troponin is normal, hemoglobin stable from prior.  Lactate is not elevated.  Not clearly infected.  Patient has had intermittent episodes of the left lateral sharp chest pains while in the ED.  She was evidently her home tramadol and 1 dose of IV fentanyl.  She had no EKG changes with these acute episodes.   D-dimer mildly elevated, prompting CTA and CT abdomen and pelvis, which were essentially unremarkable for acute findings.  Repeat troponin has been added.  Given her history of coronary artery disease requiring stent placement, Spoke with [facility], who will admit for observation.  She is currently pain-free and resting    Toy CookeyMegan Docherty, MD 11/09/14 [phone]

## 2014-11-08 NOTE — ED Notes (Addendum)
Per EMS, Patient is coming from independent living facility called [facility]. EMS original call was for left sided chest pain. Upon arrival, patient denied any chest pain. Stated the chest pain had resolved. Patient reports abdominal pain with diarrhea for about two weeks. Patient reports nausea with no vomiting. Patient was given 4 mg of Zofran during route. Vitals per EMS: 130/58, 76 HR, 20 RR, 94 % on RA. Patient has history of stents placed.

## 2014-11-09 DIAGNOSIS — I10 Essential (primary) hypertension: Secondary | ICD-10-CM | POA: Diagnosis present

## 2014-11-09 DIAGNOSIS — Z66 Do not resuscitate: Secondary | ICD-10-CM | POA: Diagnosis present

## 2014-11-09 DIAGNOSIS — Z880 Allergy status to penicillin: Secondary | ICD-10-CM | POA: Diagnosis not present

## 2014-11-09 DIAGNOSIS — G259 Extrapyramidal and movement disorder, unspecified: Secondary | ICD-10-CM | POA: Diagnosis present

## 2014-11-09 DIAGNOSIS — K2970 Gastritis, unspecified, without bleeding: Secondary | ICD-10-CM | POA: Diagnosis present

## 2014-11-09 DIAGNOSIS — I359 Nonrheumatic aortic valve disorder, unspecified: Secondary | ICD-10-CM

## 2014-11-09 DIAGNOSIS — I2510 Atherosclerotic heart disease of native coronary artery without angina pectoris: Secondary | ICD-10-CM | POA: Diagnosis present

## 2014-11-09 DIAGNOSIS — H547 Unspecified visual loss: Secondary | ICD-10-CM | POA: Diagnosis present

## 2014-11-09 DIAGNOSIS — Z87442 Personal history of urinary calculi: Secondary | ICD-10-CM | POA: Diagnosis not present

## 2014-11-09 DIAGNOSIS — H9190 Unspecified hearing loss, unspecified ear: Secondary | ICD-10-CM | POA: Diagnosis present

## 2014-11-09 DIAGNOSIS — K219 Gastro-esophageal reflux disease without esophagitis: Secondary | ICD-10-CM | POA: Diagnosis present

## 2014-11-09 DIAGNOSIS — F0390 Unspecified dementia without behavioral disturbance: Secondary | ICD-10-CM | POA: Diagnosis present

## 2014-11-09 DIAGNOSIS — R079 Chest pain, unspecified: Secondary | ICD-10-CM | POA: Diagnosis present

## 2014-11-09 DIAGNOSIS — Z885 Allergy status to narcotic agent status: Secondary | ICD-10-CM | POA: Diagnosis not present

## 2014-11-09 DIAGNOSIS — R1084 Generalized abdominal pain: Secondary | ICD-10-CM

## 2014-11-09 DIAGNOSIS — M1990 Unspecified osteoarthritis, unspecified site: Secondary | ICD-10-CM | POA: Diagnosis present

## 2014-11-09 DIAGNOSIS — Z7952 Long term (current) use of systemic steroids: Secondary | ICD-10-CM | POA: Diagnosis not present

## 2014-11-09 DIAGNOSIS — R011 Cardiac murmur, unspecified: Secondary | ICD-10-CM | POA: Diagnosis present

## 2014-11-09 DIAGNOSIS — H543 Unqualified visual loss, both eyes: Secondary | ICD-10-CM | POA: Diagnosis present

## 2014-11-09 DIAGNOSIS — Z955 Presence of coronary angioplasty implant and graft: Secondary | ICD-10-CM | POA: Diagnosis not present

## 2014-11-09 DIAGNOSIS — Z7982 Long term (current) use of aspirin: Secondary | ICD-10-CM | POA: Diagnosis not present

## 2014-11-09 DIAGNOSIS — R197 Diarrhea, unspecified: Secondary | ICD-10-CM | POA: Diagnosis present

## 2014-11-09 LAB — COMPREHENSIVE METABOLIC PANEL
ALBUMIN: 3.1 g/dL — AB (ref 3.5–5.2)
ALK PHOS: 98 U/L (ref 39–117)
ALT: 23 U/L (ref 0–35)
ANION GAP: 5 (ref 5–15)
AST: 25 U/L (ref 0–37)
BUN: 22 mg/dL (ref 6–23)
CALCIUM: 8.8 mg/dL (ref 8.4–10.5)
CO2: 25 mmol/L (ref 19–32)
CREATININE: 1.05 mg/dL (ref 0.50–1.10)
Chloride: 105 mEq/L (ref 96–112)
GFR calc Af Amer: 50 mL/min — ABNORMAL LOW (ref >90)
GFR calc non Af Amer: 43 mL/min — ABNORMAL LOW (ref >90)
Glucose, Bld: 87 mg/dL (ref 70–99)
POTASSIUM: 3.7 mmol/L (ref 3.5–5.1)
Sodium: 135 mmol/L (ref 135–145)
TOTAL PROTEIN: 5.7 g/dL — AB (ref 6.0–8.3)
Total Bilirubin: 0.5 mg/dL (ref 0.3–1.2)

## 2014-11-09 LAB — CBC WITH DIFFERENTIAL/PLATELET
BASOS PCT: 1 % (ref 0–1)
Basophils Absolute: 0 10*3/uL (ref 0.0–0.1)
Eosinophils Absolute: 0.3 10*3/uL (ref 0.0–0.7)
Eosinophils Relative: 6 % — ABNORMAL HIGH (ref 0–5)
HCT: 31.4 % — ABNORMAL LOW (ref 36.0–46.0)
HEMOGLOBIN: 10 g/dL — AB (ref 12.0–15.0)
LYMPHS ABS: 1.2 10*3/uL (ref 0.7–4.0)
LYMPHS PCT: 26 % (ref 12–46)
MCH: 28.3 pg (ref 26.0–34.0)
MCHC: 31.8 g/dL (ref 30.0–36.0)
MCV: 89 fL (ref 78.0–100.0)
MONO ABS: 0.7 10*3/uL (ref 0.1–1.0)
Monocytes Relative: 15 % — ABNORMAL HIGH (ref 3–12)
NEUTROS ABS: 2.4 10*3/uL (ref 1.7–7.7)
Neutrophils Relative %: 52 % (ref 43–77)
Platelets: 238 10*3/uL (ref 150–400)
RBC: 3.53 MIL/uL — AB (ref 3.87–5.11)
RDW: 14.5 % (ref 11.5–15.5)
WBC: 4.6 10*3/uL (ref 4.0–10.5)

## 2014-11-09 LAB — TROPONIN I

## 2014-11-09 LAB — I-STAT TROPONIN, ED: Troponin i, poc: 0.01 ng/mL (ref 0.00–0.08)

## 2014-11-09 LAB — POC OCCULT BLOOD, ED: FECAL OCCULT BLD: POSITIVE — AB

## 2014-11-09 LAB — PROTIME-INR
INR: 1.12 (ref 0.00–1.49)
Prothrombin Time: 14.5 seconds (ref 11.6–15.2)

## 2014-11-09 LAB — CLOSTRIDIUM DIFFICILE BY PCR: CDIFFPCR: NEGATIVE

## 2014-11-09 MED ORDER — ALPRAZOLAM 0.25 MG PO TABS
0.2500 mg | ORAL_TABLET | Freq: Every day | ORAL | Status: DC | PRN
  Filled 2014-11-09: qty 1

## 2014-11-09 MED ORDER — ACETAMINOPHEN 325 MG PO TABS
650.0000 mg | ORAL_TABLET | ORAL | Status: DC | PRN
  Administered 2014-11-09: 650 mg via ORAL
  Filled 2014-11-09: qty 2

## 2014-11-09 MED ORDER — SUCRALFATE 1 GM/10ML PO SUSP
1.0000 g | Freq: Three times a day (TID) | ORAL | Status: DC
  Administered 2014-11-09 – 2014-11-10 (×4): 1 g via ORAL
  Filled 2014-11-09 (×5): qty 10

## 2014-11-09 MED ORDER — TRAMADOL HCL 50 MG PO TABS
50.0000 mg | ORAL_TABLET | Freq: Every day | ORAL | Status: DC
  Administered 2014-11-09: 50 mg via ORAL
  Filled 2014-11-09: qty 1

## 2014-11-09 MED ORDER — LOSARTAN POTASSIUM 50 MG PO TABS
50.0000 mg | ORAL_TABLET | Freq: Every day | ORAL | Status: DC
  Administered 2014-11-09 – 2014-11-10 (×2): 50 mg via ORAL
  Filled 2014-11-09 (×2): qty 1

## 2014-11-09 MED ORDER — PANTOPRAZOLE SODIUM 40 MG PO TBEC
40.0000 mg | DELAYED_RELEASE_TABLET | Freq: Two times a day (BID) | ORAL | Status: DC
  Administered 2014-11-09 – 2014-11-10 (×3): 40 mg via ORAL
  Filled 2014-11-09 (×3): qty 1

## 2014-11-09 MED ORDER — DILTIAZEM HCL ER BEADS 300 MG PO CP24
300.0000 mg | ORAL_CAPSULE | Freq: Every day | ORAL | Status: DC
  Administered 2014-11-09 – 2014-11-10 (×2): 300 mg via ORAL
  Filled 2014-11-09 (×2): qty 1

## 2014-11-09 MED ORDER — GI COCKTAIL ~~LOC~~: 30.0000 mL | Freq: Four times a day (QID) | ORAL | Status: DC | PRN

## 2014-11-09 MED ORDER — ISOSORBIDE MONONITRATE ER 30 MG PO TB24
30.0000 mg | ORAL_TABLET | Freq: Every day | ORAL | Status: DC
  Administered 2014-11-09 – 2014-11-10 (×2): 30 mg via ORAL
  Filled 2014-11-09 (×2): qty 1

## 2014-11-09 MED ORDER — PANTOPRAZOLE SODIUM 40 MG PO TBEC: 40.0000 mg | DELAYED_RELEASE_TABLET | Freq: Every day | ORAL | Status: DC

## 2014-11-09 MED ORDER — ASPIRIN EC 81 MG PO TBEC: 81.0000 mg | DELAYED_RELEASE_TABLET | Freq: Every day | ORAL | Status: DC

## 2014-11-09 MED ORDER — LIDOCAINE 5 % EX PTCH
1.0000 | MEDICATED_PATCH | CUTANEOUS | Status: DC
  Administered 2014-11-09 – 2014-11-10 (×2): 1 via TRANSDERMAL
  Filled 2014-11-09 (×2): qty 1

## 2014-11-09 MED ORDER — PANTOPRAZOLE SODIUM 40 MG IV SOLR
40.0000 mg | Freq: Two times a day (BID) | INTRAVENOUS | Status: DC
  Filled 2014-11-09: qty 40

## 2014-11-09 MED ORDER — ESCITALOPRAM OXALATE 10 MG PO TABS
10.0000 mg | ORAL_TABLET | Freq: Every day | ORAL | Status: DC
  Administered 2014-11-09 – 2014-11-10 (×2): 10 mg via ORAL
  Filled 2014-11-09 (×2): qty 1

## 2014-11-09 MED ORDER — ONDANSETRON HCL 4 MG/2ML IJ SOLN: 4.0000 mg | Freq: Four times a day (QID) | INTRAMUSCULAR | Status: DC | PRN

## 2014-11-09 NOTE — Progress Notes (Signed)
CSW (Clinical Child psychotherapistocial Worker) notified pt was admitted from facility. CSW spoke with pt daughter at bedside. Pt admitted from [facility] and plan is for her to return. Family will provide transportation. CSW notified RNCM. Pt has no current hospital social work needs. CSW signing off.  Brannon Levene, LCSWA [phone]

## 2014-11-09 NOTE — Evaluation (Signed)
Physical Therapy Evaluation Patient Details Name: Victoria Juarez MRN: [id number] DOB: Mar 24, 1915 Today's Date: 11/09/2014   History of Present Illness  Patient is a 80 y/o female who presents from [facility] with abdominal pain, chest pain and diarrhea. CT chest is negative for any acute pulmonary embolism or pneumonia.    Clinical Impression  Patient presents with generalized weakness and deconditioning due to hospitalization as well as balance deficits impacting safe mobility. Pt from independent living facility and Mod I for all ADLs and mobility PTA. Concerned about pt returning home alone due to being fall risk. Pt would benefit from ALF and skilled PT to improve gait, balance and overall safe mobility so pt can maximize independence and minimize fall risk prior to return home.    Follow Up Recommendations Supervision/Assistance - 24 hour;Home health PT (ALF)    Equipment Recommendations  None recommended by PT    Recommendations for Other Services OT consult     Precautions / Restrictions Precautions Precautions: Fall Restrictions Weight Bearing Restrictions: No      Mobility  Bed Mobility               General bed mobility comments: Received sitting EOB upon PT arrival.   Transfers Overall transfer level: Needs assistance Equipment used: Rolling walker (2 wheeled) Transfers: Sit to/from Stand Sit to Stand: Min guard         General transfer comment: Min guard for safety. Increased effort to stand.  Ambulation/Gait Ambulation/Gait assistance: Min guard Ambulation Distance (Feet): 150 Feet Assistive device: Rolling walker (2 wheeled) Gait Pattern/deviations: Step-through pattern;Decreased stride length;Trunk flexed     General Gait Details: Pt with mildly unsteady gait but no overt LOB. No dyspnea present but pt reports wobbly knees.  Stairs            Wheelchair Mobility    Modified Rankin (Stroke Patients Only)       Balance  Overall balance assessment: Needs assistance Sitting-balance support: Feet supported;No upper extremity supported Sitting balance-Leahy Scale: Fair     Standing balance support: During functional activity Standing balance-Leahy Scale: Poor                               Pertinent Vitals/Pain Pain Assessment: No/denies pain    Home Living Family/patient expects to be discharged to:: Assisted living               Home Equipment: Walker - 4 wheels;Bedside commode;Shower seat      Prior Function Level of Independence: Independent with assistive device(s)         Comments: Pt ambulates with rollator to dining hall. Reports (I) with ADLs and IADls. Reports multiple falls in shower/bathroom.     Hand Dominance        Extremity/Trunk Assessment   Upper Extremity Assessment: Defer to OT evaluation;Overall WFL for tasks assessed           Lower Extremity Assessment: Generalized weakness         Communication   Communication: HOH  Cognition Arousal/Alertness: Awake/alert Behavior During Therapy: WFL for tasks assessed/performed Overall Cognitive Status: Within Functional Limits for tasks assessed                      General Comments      Exercises        Assessment/Plan    PT Assessment Patient needs continued PT services  PT Diagnosis  Generalized weakness;Difficulty walking   PT Problem List Decreased strength;Decreased balance;Decreased mobility  PT Treatment Interventions Balance training;Gait training;Neuromuscular re-education;Patient/family education;Functional mobility training;Therapeutic activities;Therapeutic exercise   PT Goals (Current goals can be found in the Care Plan section) Acute Rehab PT Goals Patient Stated Goal: to get stronger PT Goal Formulation: With patient Time For Goal Achievement: 11/23/14 Potential to Achieve Goals: Fair    Frequency Min 3X/week   Barriers to discharge Decreased caregiver  support Pt lives alone in independent living     Co-evaluation               End of Session Equipment Utilized During Treatment: Gait belt Activity Tolerance: Patient tolerated treatment well Patient left: in bed;with call bell/phone within reach;with bed alarm set (sitting EOB.) Nurse Communication: Mobility status         Time: [id number] PT Time Calculation (min) (ACUTE ONLY): 15 min   Charges:   PT Evaluation $Initial PT Evaluation Tier I: 1 Procedure PT Treatments $Gait Training: 8-22 mins   PT G CodesAlvie Heidelberg:        Folan, Bayard More A 11/09/2014, 5:01 PM  Alvie HeidelbergShauna Folan, PT, DPT [phone]

## 2014-11-09 NOTE — Progress Notes (Signed)
Echocardiogram 2D Echocardiogram has been performed.  Dorothey BasemanReel, Elisea Khader M 11/09/2014, 10:03 AM

## 2014-11-09 NOTE — H&P (Signed)
[facility] History and Physical  Patient: Victoria Juarez  KGM:[id number]  DOB: January 07, 1915  DOS: the patient was seen and examined on 11/09/2014 PCP: No primary care provider on file.  Chief Complaint: Chest pain and abdominal pain as well as diarrhea  HPI: Victoria Juarez is a 80 y.o. female with Past medical history of hypertension, arthritis, hearing loss, coronary artery disease. Patient presented with complaints of chest pain and abdominal pain. History was obtained from ED documentation, patient as well as her daughter on the phone. Patient complained of some sternal chest pain which was sharp and then going to her stomach around her umbilicus. As per daughter the pain was so severe that patient was frightened. Patient also complains of some diarrhea which as per the patient has been ongoing since last 2 weeks and as per the daughter has been ongoing since Christmas as the patient stated with her on the Christmas day and did not have any diarrhea on that day. Patient also claims that she has seen bright red blood and sometimes dark stool. She denies any fever or chills denies any chest pain or abdominal pain at the time of my evaluation denies any nausea or vomiting denies any burning urination. Attempt to reach the assisted living facility were not successful. No recent antibiotics as per daughter.  The patient is coming from ALF. And at her baseline dependent for most of her ADL.  Review of Systems: as mentioned in the history of present illness.  A Comprehensive review of the other systems is negative.  Past Medical History  Diagnosis Date  . Hypertension   . Hx of heart artery stent   . Arthritis   . Hearing loss   . Vision loss, bilateral   . Kidney stone     Patient has distention on the right side from scar tissue from a past embedded kidney stone.    Past Surgical History  Procedure Laterality Date  . Renal calculi      cardiac stent x2  . Coronary stent  placement     Social History:  reports that she has never smoked. She has never used smokeless tobacco. She reports that she does not drink alcohol or use illicit drugs.  Allergies  Allergen Reactions  . Codeine Other (See Comments)    "makes her very sick"  . Novocain [Procaine] Other (See Comments)    Unknown reaction    No family history on file.  Prior to Admission medications   Medication Sig Start Date End Date Taking? Authorizing Provider  ALPRAZolam (XANAX) 0.25 MG tablet Take 1 tablet (0.25 mg total) by mouth daily as needed for sleep or anxiety. 02/21/13  Yes Minda Meo, MD  Artificial Tear Ointment (ARTIFICIAL TEARS) ointment Place 1 application into both eyes at bedtime.   Yes Historical Provider, MD  aspirin EC 81 MG tablet Take 81 mg by mouth daily.   Yes Historical Provider, MD  diltiazem (TIAZAC) 300 MG 24 hr capsule Take 300 mg by mouth daily.   Yes Historical Provider, MD  docusate sodium (COLACE) 100 MG capsule Take 100 mg by mouth daily.    Yes Historical Provider, MD  escitalopram (LEXAPRO) 10 MG tablet Take 10 mg by mouth daily. 10/13/14  Yes Historical Provider, MD  HORSE CHESTNUT PO Take 1 capsule by mouth at bedtime. For leg pain   Yes Historical Provider, MD  isosorbide mononitrate (IMDUR) 30 MG 24 hr tablet Take 30 mg by mouth daily.   Yes  Historical Provider, MD  losartan (COZAAR) 50 MG tablet Take 50 mg by mouth daily. 09/22/14  Yes Historical Provider, MD  omeprazole (PRILOSEC) 40 MG capsule Take 40 mg by mouth daily.   Yes Historical Provider, MD  polyethylene glycol (MIRALAX / GLYCOLAX) packet Take 17 g by mouth daily.   Yes Historical Provider, MD  prednisoLONE acetate (PRED FORTE) 1 % ophthalmic suspension Place 1 drop into both eyes daily.   Yes Historical Provider, MD  traMADol (ULTRAM) 50 MG tablet Take 50 mg by mouth at bedtime.   Yes Historical Provider, MD  triamterene-hydrochlorothiazide (MAXZIDE-25) 37.5-25 MG per tablet Take 1 tablet by  mouth daily.   Yes Historical Provider, MD  aspirin EC 81 MG EC tablet Take 1 tablet (81 mg total) by mouth daily. 02/21/13   Minda Meo, MD  calcitonin, salmon, (MIACALCIN/FORTICAL) 200 UNIT/ACT nasal spray Place 1 spray into the nose daily. 02/21/13   Minda Meo, MD  carisoprodol (SOMA) 350 MG tablet Take 1 tablet (350 mg total) by mouth 3 (three) times daily as needed (spasm). 02/21/13   Minda Meo, MD    Physical Exam: Filed Vitals:   11/09/14 0100 11/09/14 0115 11/09/14 0130 11/09/14 0157  BP: 101/42 102/50 118/49 123/57  Pulse: 61 64 67 60  Temp:    98 F (36.7 C)  TempSrc:    Oral  Resp: 19 14 15 18   Height:    4\' 11"  (1.499 m)  Weight:    44.589 kg (98 lb 4.8 oz)  SpO2: 94% 97% 94% 94%    General: Alert, Awake and Oriented to Time, Place and Person. Appear in mild distress Eyes: PERRL ENT: Oral Mucosa clear moist. Neck: no JVD Cardiovascular: S1 and S2 Present, aortic systolic Murmur, Peripheral Pulses Present Respiratory: Bilateral Air entry equal and Decreased, Clear to Auscultation, noCrackles, no wheezes Abdomen: Bowel Sound present sluggish, Soft and non tender Skin: no Rash Extremities: no Pedal edema, no calf tenderness Neurologic: Grossly no focal neuro deficit.  Labs on Admission:  CBC:  Recent Labs Lab 11/08/14 1829  WBC 4.8  NEUTROABS 2.7  HGB 10.6*  HCT 33.2*  MCV 89.7  PLT 253    CMP     Component Value Date/Time   NA 136 11/08/2014 1829   K 3.9 11/08/2014 1829   CL 104 11/08/2014 1829   CO2 23 11/08/2014 1829   GLUCOSE 106* 11/08/2014 1829   BUN 28* 11/08/2014 1829   CREATININE 1.20* 11/08/2014 1829   CALCIUM 8.9 11/08/2014 1829   PROT 6.1 11/08/2014 1829   ALBUMIN 3.5 11/08/2014 1829   AST 24 11/08/2014 1829   ALT 17 11/08/2014 1829   ALKPHOS 122* 11/08/2014 1829   BILITOT 0.2* 11/08/2014 1829   GFRNONAA 37* 11/08/2014 1829   GFRAA 42* 11/08/2014 1829     Recent Labs Lab 11/08/14 1829  LIPASE 27   No  results for input(s): AMMONIA in the last 168 hours.  No results for input(s): CKTOTAL, CKMB, CKMBINDEX, TROPONINI in the last 168 hours. BNP (last 3 results) No results for input(s): PROBNP in the last 8760 hours.  Radiological Exams on Admission: Ct Angio Chest Pe W/cm &/or Wo Cm  11/09/2014   CLINICAL DATA:  Periumbilical pain with diarrhea for 2 weeks.  EXAM: CT ANGIOGRAPHY CHEST  CT ABDOMEN AND PELVIS WITH CONTRAST  TECHNIQUE: Multidetector CT imaging of the chest was performed using the standard protocol during bolus administration of intravenous contrast. Multiplanar CT image reconstructions and MIPs were  obtained to evaluate the vascular anatomy. Multidetector CT imaging of the abdomen and pelvis was performed using the standard protocol during bolus administration of intravenous contrast.  CONTRAST:  80mL OMNIPAQUE IOHEXOL 350 MG/ML SOLN  COMPARISON:  10/07/2009 abdominal pelvic CT. Chest CT of 02/19/2013. Chest radiograph of earlier today.  FINDINGS: CTA CHEST FINDINGS  Lungs/Pleura: Mild motion degradation. Scattered areas of bibasilar scarring or subsegmental atelectasis. No right-sided pulmonary nodule to correspond to the questioned plain film abnormality. No lobar consolidation.  No pleural fluid.  Heart/Mediastinum: The quality of this examination for evaluation of pulmonary embolism is good. No evidence of pulmonary embolism. Pulmonary artery enlargement, with the outflow tract measuring 3.8 cm.  Dense aortic and branch vessel atherosclerosis. Moderate cardiomegaly with coronary artery atherosclerosis. No mediastinal or hilar adenopathy.  CT ABDOMEN and PELVIS FINDINGS  Hepatobiliary: Scattered tiny hepatic lesions are likely cysts. Normal gallbladder, without biliary ductal dilatation.  Pancreas: No pancreatic ductal dilatation or evidence of acute pancreatitis. Suspect is cystic lesion within the pancreatic head at 1.4 cm on image 31 of series 11. This may have been present on the prior  and is suboptimally evaluated on that study.  Spleen: Normal  Adrenals/Urinary Tract: Normal adrenal glands. Mild renal cortical thinning. Interpolar left renal cyst. Bilateral too small to characterize renal lesions. No hydronephrosis. Normal urinary bladder.  Stomach/Bowel: Underdistended stomach. Apparent proximal gastric wall thickening could be secondary. Example image 20. Normal colon and terminal ileum. Appendix is not visualized but there is no evidence of right lower quadrant inflammation.  Vascular/Lymphatic: Aortic and branch vessel atherosclerosis. No retroperitoneal or retrocrural adenopathy.  Reproductive: Normal uterus, without adnexal mass.  Other: Fat containing left inguinal hernia. No significant free fluid.  Musculoskeletal: Moderate osteopenia. Moderate convex left lumbar spine curvature. Advanced thoracolumbar spondylosis.  Review of the MIP images confirms the above findings.  IMPRESSION: 1.  No evidence of pulmonary embolism. 2. Pulmonary artery enlargement suggests pulmonary arterial hypertension. 3. No evidence of right-sided pulmonary nodule. 4. Gastric underdistention. Apparent wall thickening could be secondary. Gastritis cannot be excluded. 5. No other explanation for pain. 6. Suspicion of a cystic lesion in the pancreatic head. This is of doubtful clinical significance, given patient age. Most likely a pseudocyst.   Electronically Signed   By: Jeronimo Greaves M.D.   On: 11/09/2014 00:11   Ct Abdomen Pelvis W Contrast  11/09/2014   CLINICAL DATA:  Periumbilical pain with diarrhea for 2 weeks.  EXAM: CT ANGIOGRAPHY CHEST  CT ABDOMEN AND PELVIS WITH CONTRAST  TECHNIQUE: Multidetector CT imaging of the chest was performed using the standard protocol during bolus administration of intravenous contrast. Multiplanar CT image reconstructions and MIPs were obtained to evaluate the vascular anatomy. Multidetector CT imaging of the abdomen and pelvis was performed using the standard protocol  during bolus administration of intravenous contrast.  CONTRAST:  80mL OMNIPAQUE IOHEXOL 350 MG/ML SOLN  COMPARISON:  10/07/2009 abdominal pelvic CT. Chest CT of 02/19/2013. Chest radiograph of earlier today.  FINDINGS: CTA CHEST FINDINGS  Lungs/Pleura: Mild motion degradation. Scattered areas of bibasilar scarring or subsegmental atelectasis. No right-sided pulmonary nodule to correspond to the questioned plain film abnormality. No lobar consolidation.  No pleural fluid.  Heart/Mediastinum: The quality of this examination for evaluation of pulmonary embolism is good. No evidence of pulmonary embolism. Pulmonary artery enlargement, with the outflow tract measuring 3.8 cm.  Dense aortic and branch vessel atherosclerosis. Moderate cardiomegaly with coronary artery atherosclerosis. No mediastinal or hilar adenopathy.  CT ABDOMEN and PELVIS FINDINGS  Hepatobiliary: Scattered tiny hepatic lesions are likely cysts. Normal gallbladder, without biliary ductal dilatation.  Pancreas: No pancreatic ductal dilatation or evidence of acute pancreatitis. Suspect is cystic lesion within the pancreatic head at 1.4 cm on image 31 of series 11. This may have been present on the prior and is suboptimally evaluated on that study.  Spleen: Normal  Adrenals/Urinary Tract: Normal adrenal glands. Mild renal cortical thinning. Interpolar left renal cyst. Bilateral too small to characterize renal lesions. No hydronephrosis. Normal urinary bladder.  Stomach/Bowel: Underdistended stomach. Apparent proximal gastric wall thickening could be secondary. Example image 20. Normal colon and terminal ileum. Appendix is not visualized but there is no evidence of right lower quadrant inflammation.  Vascular/Lymphatic: Aortic and branch vessel atherosclerosis. No retroperitoneal or retrocrural adenopathy.  Reproductive: Normal uterus, without adnexal mass.  Other: Fat containing left inguinal hernia. No significant free fluid.  Musculoskeletal: Moderate  osteopenia. Moderate convex left lumbar spine curvature. Advanced thoracolumbar spondylosis.  Review of the MIP images confirms the above findings.  IMPRESSION: 1.  No evidence of pulmonary embolism. 2. Pulmonary artery enlargement suggests pulmonary arterial hypertension. 3. No evidence of right-sided pulmonary nodule. 4. Gastric underdistention. Apparent wall thickening could be secondary. Gastritis cannot be excluded. 5. No other explanation for pain. 6. Suspicion of a cystic lesion in the pancreatic head. This is of doubtful clinical significance, given patient age. Most likely a pseudocyst.   Electronically Signed   By: Jeronimo GreavesKyle  Talbot M.D.   On: 11/09/2014 00:11   Dg Chest Port 1 View  11/08/2014   CLINICAL DATA:  Chest pain and hypertension.  EXAM: PORTABLE CHEST - 1 VIEW  COMPARISON:  09/12/2008, 02/19/2013  FINDINGS: The heart is enlarged. There is perihilar peribronchial thickening. Bronchiectasis is noted at the right lung base. There is question of a right lower lobe or right middle lobe nodule warranting further evaluation with CT exam. There are no focal consolidations or definite pleural effusions. No pulmonary edema.  IMPRESSION: 1. Bronchitic changes and right lower lobe bronchiectasis. 2. Question of right lower lobe/right middle lobe lung nodule. Further evaluation with CT of chest with contrast is recommended.   Electronically Signed   By: Rosalie GumsBeth  Brown M.D.   On: 11/08/2014 19:28    EKG: Independently reviewed. normal sinus rhythm, nonspecific ST and T waves changes.  Assessment/Plan Principal Problem:   Chest pain Active Problems:   Esophageal reflux   Essential hypertension, benign   Abdominal pain, generalized   Diarrhea   HOH (hard of hearing)   1. Chest pain The patient is presenting with complaints of chest pain. On further evaluation CT chest is negative for any acute pulmonary embolism or pneumonia. At present we will continue monitoring her serial troponin and all pain  an echocardiogram in the morning.  2. Abdominal pain, diarrhea. Patient's stool was Hemoccult positive. Hemoglobin currently 10 which is her average. We will continue to monitor her H&H closely. Monitor her for any acute bleeding. She will remain nothing by mouth except medications. Protonix every 12 hours. She has received aspirin but at present I would hold aspirin in view of the possible bleeding. CT abdomen negative for any acute abnormality. Check lipase and lactic acid levels. If her H&H continues to drop she may require GI consultation. Check C. difficile  3. Hypertension. Continue home medications  4. Dementia Movement disorder. Continue home medications.  Advance goals of care discussion: DNR/DNI as per my discussion with patient's daughter on the phone   DVT Prophylaxis: mechanical compression  device Nutrition: Nothing by mouth except medications  Family Communication: Discussed with daughter on the phone, opportunity was given to ask question and all questions were answered satisfactorily at the time of interview. Disposition: Admitted to inpatient in telemetry unit.  Author: Lynden OxfordPranav Kinslea Frances, MD [facility] Pager: [phone] 11/09/2014, 4:50 AM    If 7PM-7AM, please contact night-coverage [email or web address] Password [email or web address]

## 2014-11-09 NOTE — Progress Notes (Signed)
UR Completed Charelle Petrakis Graves-Bigelow, RN,BSN [phone]  

## 2014-11-09 NOTE — Care Management Note (Signed)
    Page 1 of 1   11/10/2014     11:43:47 AM CARE MANAGEMENT NOTE 11/10/2014  Patient:  Victoria Juarez,Victoria Juarez   Account Number:  [id number]  Date Initiated:  11/09/2014  Documentation initiated by:  GRAVES-BIGELOW,BRENDA  Subjective/Objective Assessment:   Pt admitted for Chest pain, abdominal pain and diarrhea. [facility] is [facility]. Pt has RW and transportation back via family.     Action/Plan:   CM will continue to monitor for any disposition needs.   Anticipated DC Date:  11/10/2014   Anticipated DC Plan:  HOME/SELF CARE      DC Planning Services  CM consult      Choice offered to / List presented to:          HH arranged  HH-2 PT      HH agency  OTHER - SEE NOTE   Status of service:  Completed, signed off Medicare Important Message given?  YES (If response is "NO", the following Medicare IM given date fields will be blank) Date Medicare IM given:  11/09/2014 Medicare IM given by:  GRAVES-BIGELOW,BRENDA Date Additional Medicare IM given:   Additional Medicare IM given by:    Discharge Disposition:  HOME W HOME HEALTH SERVICES  Per UR Regulation:  Reviewed for med. necessity/level of care/duration of stay  If discussed at Long Length of Stay Meetings, dates discussed:    Comments:  11/10/14 1140 Breanna Shorkey RN MSN BSN CCM Pt will need home health PT.  Discussed with pt and dtr who agree.  [facility].  Order and F2F, discharge summary and PT note faxed to [phone].

## 2014-11-09 NOTE — Progress Notes (Signed)
Patient admitted after midnight.  + murmur on exam Multiple complaints that seem to have been worked up by PCP.   Will trend Hgb- watch stools PT eval Back pain- lidocaine patch placed Daughter not wanting to be aggressive in her care Hope back to ILF tomm Marlin CanaryJessica Vita Currin DO

## 2014-11-10 LAB — CBC
HEMATOCRIT: 34.4 % — AB (ref 36.0–46.0)
Hemoglobin: 11 g/dL — ABNORMAL LOW (ref 12.0–15.0)
MCH: 28.6 pg (ref 26.0–34.0)
MCHC: 32 g/dL (ref 30.0–36.0)
MCV: 89.6 fL (ref 78.0–100.0)
Platelets: 264 10*3/uL (ref 150–400)
RBC: 3.84 MIL/uL — ABNORMAL LOW (ref 3.87–5.11)
RDW: 14.5 % (ref 11.5–15.5)
WBC: 5.8 10*3/uL (ref 4.0–10.5)

## 2014-11-10 LAB — BASIC METABOLIC PANEL
Anion gap: 14 (ref 5–15)
BUN: 21 mg/dL (ref 6–23)
CHLORIDE: 102 meq/L (ref 96–112)
CO2: 22 mmol/L (ref 19–32)
CREATININE: 1.17 mg/dL — AB (ref 0.50–1.10)
Calcium: 9.3 mg/dL (ref 8.4–10.5)
GFR calc non Af Amer: 38 mL/min — ABNORMAL LOW (ref >90)
GFR, EST AFRICAN AMERICAN: 44 mL/min — AB (ref >90)
Glucose, Bld: 94 mg/dL (ref 70–99)
Potassium: 3.5 mmol/L (ref 3.5–5.1)
Sodium: 138 mmol/L (ref 135–145)

## 2014-11-10 MED ORDER — SUCRALFATE 1 GM/10ML PO SUSP: 1.0000 g | Freq: Three times a day (TID) | ORAL | Status: DC

## 2014-11-10 MED ORDER — POLYETHYLENE GLYCOL 3350 17 G PO PACK: 17.0000 g | PACK | Freq: Every day | ORAL | Status: AC | PRN

## 2014-11-10 NOTE — Discharge Instructions (Signed)

## 2014-11-10 NOTE — Discharge Summary (Signed)
Physician Discharge Summary  Victoria Juarez ZOX:[id number] DOB: 1915/05/30 DOA: 11/08/2014  PCP: Minda Meo, MD  Admit date: 11/08/2014 Discharge date: 11/10/2014  Time spent: 35 minutes  Recommendations for Outpatient Follow-up:  Follow up on GI symptoms Cbc, bmp 1 week -holding BP meds as have been low (resume as needed)  Discharge Diagnoses:  Principal Problem:   Chest pain Active Problems:   Esophageal reflux   Essential hypertension, benign   Abdominal pain, generalized   Diarrhea   HOH (hard of hearing)   Discharge Condition: improved  Diet recommendation: as tolerated  Filed Weights   11/09/14 0157 11/10/14 0400  Weight: 44.589 kg (98 lb 4.8 oz) 44.478 kg (98 lb 0.9 oz)    History of present illness:  Victoria Juarez is a 81 y.o. female with Past medical history of hypertension, arthritis, hearing loss, coronary artery disease. Patient presented with complaints of chest pain and abdominal pain. History was obtained from ED documentation, patient as well as her daughter on the phone. Patient complained of some sternal chest pain which was sharp and then going to her stomach around her umbilicus. As per daughter the pain was so severe that patient was frightened. Patient also complains of some diarrhea which as per the patient has been ongoing since last 2 weeks and as per the daughter has been ongoing since Christmas as the patient stated with her on the Christmas day and did not have any diarrhea on that day. Patient also claims that she has seen bright red blood and sometimes dark stool. She denies any fever or chills denies any chest pain or abdominal pain at the time of my evaluation denies any nausea or vomiting denies any burning urination. Attempt to reach the assisted living facility were not successful. No recent antibiotics as per daughter.  Hospital Course:  Chest pain- ? Due to gastritis- carafate seems to have improved symptoms, echo done with  findings below- defer to PCP for further work up if desired.  Hgb stable- no further episodes of bleeding- if continues ? GI consult as outpatient CE negative  Procedures:  Echo: Study Conclusions  - Left ventricle: E/e&'>30 consistent with elevated LV filling pressures. The cavity size was normal. Systolic function was normal. The estimated ejection fraction was in the range of 60% to 65%. Wall motion was normal; there were no regional wall motion abnormalities. - Aortic valve: Moderate thickening and calcification. Valve mobility was restricted. There was moderate to severe stenosis. Valve area (VTI): 0.69 cm^2. Valve area (Vmax): 0.76 cm^2. Valve area (Vmean): 0.69 cm^2. - Mitral valve: There is mild thickening of the anterior MV leaflet and moderate thickening and calcification of the posterior MV leaflet which appears fixed. The calcification extends up into the chordae tendinae. Calcified annulus. Mildly thickened leaflets . Mild diffuse thickening and calcification, with mild involvement of chords. There was moderate regurgitation directed eccentrically and toward the free wall. - Left atrium: The atrium was severely dilated. - Atrial septum: A patent foramen ovale cannot be excluded. - Pulmonic valve: There was moderate regurgitation. - Recommendations: reccomend agitated saline contrast injection to evaluate for PFO.  Recommendations: reccomend agitated saline contrast injection to evaluate for PFO.        Consultations:    Discharge Exam: Filed Vitals:   11/10/14 0730  BP: 109/47  Pulse: 62  Temp: 98.7 F (37.1 C)  Resp: 19    General: perkier today- anxious to go home Cardiovascular: rrr Respiratory: clear  Discharge Instructions  Discharge Instructions    Diet - low sodium heart healthy    Complete by:  As directed      Discharge instructions    Complete by:  As directed   Home health PT 24 hour  supervision/assistance Continue carafate for 1 week - speak with PCP to see if needs to be continued     Increase activity slowly    Complete by:  As directed           Current Discharge Medication List    START taking these medications   Details  sucralfate (CARAFATE) 1 GM/10ML suspension Take 10 mLs (1 g total) by mouth 4 (four) times daily -  with meals and at bedtime. Qty: 420 mL, Refills: 0      CONTINUE these medications which have CHANGED   Details  polyethylene glycol (MIRALAX / GLYCOLAX) packet Take 17 g by mouth daily as needed. Qty: 14 each, Refills: 0      CONTINUE these medications which have NOT CHANGED   Details  ALPRAZolam (XANAX) 0.25 MG tablet Take 1 tablet (0.25 mg total) by mouth daily as needed for sleep or anxiety. Qty: 30 tablet, Refills: 0    Artificial Tear Ointment (ARTIFICIAL TEARS) ointment Place 1 application into both eyes at bedtime.    diltiazem (TIAZAC) 300 MG 24 hr capsule Take 300 mg by mouth daily.    docusate sodium (COLACE) 100 MG capsule Take 100 mg by mouth daily.     escitalopram (LEXAPRO) 10 MG tablet Take 10 mg by mouth daily.    HORSE CHESTNUT PO Take 1 capsule by mouth at bedtime. For leg pain    isosorbide mononitrate (IMDUR) 30 MG 24 hr tablet Take 30 mg by mouth daily.    losartan (COZAAR) 50 MG tablet Take 50 mg by mouth daily.    omeprazole (PRILOSEC) 40 MG capsule Take 40 mg by mouth daily.    prednisoLONE acetate (PRED FORTE) 1 % ophthalmic suspension Place 1 drop into both eyes daily.    traMADol (ULTRAM) 50 MG tablet Take 50 mg by mouth at bedtime.    aspirin EC 81 MG EC tablet Take 1 tablet (81 mg total) by mouth daily.    calcitonin, salmon, (MIACALCIN/FORTICAL) 200 UNIT/ACT nasal spray Place 1 spray into the nose daily. Qty: 3.7 mL, Refills: 0    carisoprodol (SOMA) 350 MG tablet Take 1 tablet (350 mg total) by mouth 3 (three) times daily as needed (spasm). Qty: 30 tablet, Refills: 0      STOP taking  these medications     triamterene-hydrochlorothiazide (MAXZIDE-25) 37.5-25 MG per tablet        Allergies  Allergen Reactions  . Codeine Other (See Comments)    "makes her very sick"  . Novocain [Procaine] Other (See Comments)    Unknown reaction      The results of significant diagnostics from this hospitalization (including imaging, microbiology, ancillary and laboratory) are listed below for reference.    Significant Diagnostic Studies: Ct Angio Chest Pe W/cm &/or Wo Cm  11/09/2014   CLINICAL DATA:  Periumbilical pain with diarrhea for 2 weeks.  EXAM: CT ANGIOGRAPHY CHEST  CT ABDOMEN AND PELVIS WITH CONTRAST  TECHNIQUE: Multidetector CT imaging of the chest was performed using the standard protocol during bolus administration of intravenous contrast. Multiplanar CT image reconstructions and MIPs were obtained to evaluate the vascular anatomy. Multidetector CT imaging of the abdomen and pelvis was performed using the standard protocol during bolus administration of intravenous  contrast.  CONTRAST:  80mL OMNIPAQUE IOHEXOL 350 MG/ML SOLN  COMPARISON:  10/07/2009 abdominal pelvic CT. Chest CT of 02/19/2013. Chest radiograph of earlier today.  FINDINGS: CTA CHEST FINDINGS  Lungs/Pleura: Mild motion degradation. Scattered areas of bibasilar scarring or subsegmental atelectasis. No right-sided pulmonary nodule to correspond to the questioned plain film abnormality. No lobar consolidation.  No pleural fluid.  Heart/Mediastinum: The quality of this examination for evaluation of pulmonary embolism is good. No evidence of pulmonary embolism. Pulmonary artery enlargement, with the outflow tract measuring 3.8 cm.  Dense aortic and branch vessel atherosclerosis. Moderate cardiomegaly with coronary artery atherosclerosis. No mediastinal or hilar adenopathy.  CT ABDOMEN and PELVIS FINDINGS  Hepatobiliary: Scattered tiny hepatic lesions are likely cysts. Normal gallbladder, without biliary ductal dilatation.   Pancreas: No pancreatic ductal dilatation or evidence of acute pancreatitis. Suspect is cystic lesion within the pancreatic head at 1.4 cm on image 31 of series 11. This may have been present on the prior and is suboptimally evaluated on that study.  Spleen: Normal  Adrenals/Urinary Tract: Normal adrenal glands. Mild renal cortical thinning. Interpolar left renal cyst. Bilateral too small to characterize renal lesions. No hydronephrosis. Normal urinary bladder.  Stomach/Bowel: Underdistended stomach. Apparent proximal gastric wall thickening could be secondary. Example image 20. Normal colon and terminal ileum. Appendix is not visualized but there is no evidence of right lower quadrant inflammation.  Vascular/Lymphatic: Aortic and branch vessel atherosclerosis. No retroperitoneal or retrocrural adenopathy.  Reproductive: Normal uterus, without adnexal mass.  Other: Fat containing left inguinal hernia. No significant free fluid.  Musculoskeletal: Moderate osteopenia. Moderate convex left lumbar spine curvature. Advanced thoracolumbar spondylosis.  Review of the MIP images confirms the above findings.  IMPRESSION: 1.  No evidence of pulmonary embolism. 2. Pulmonary artery enlargement suggests pulmonary arterial hypertension. 3. No evidence of right-sided pulmonary nodule. 4. Gastric underdistention. Apparent wall thickening could be secondary. Gastritis cannot be excluded. 5. No other explanation for pain. 6. Suspicion of a cystic lesion in the pancreatic head. This is of doubtful clinical significance, given patient age. Most likely a pseudocyst.   Electronically Signed   By: Jeronimo Greaves M.D.   On: 11/09/2014 00:11   Ct Abdomen Pelvis W Contrast  11/09/2014   CLINICAL DATA:  Periumbilical pain with diarrhea for 2 weeks.  EXAM: CT ANGIOGRAPHY CHEST  CT ABDOMEN AND PELVIS WITH CONTRAST  TECHNIQUE: Multidetector CT imaging of the chest was performed using the standard protocol during bolus administration of  intravenous contrast. Multiplanar CT image reconstructions and MIPs were obtained to evaluate the vascular anatomy. Multidetector CT imaging of the abdomen and pelvis was performed using the standard protocol during bolus administration of intravenous contrast.  CONTRAST:  80mL OMNIPAQUE IOHEXOL 350 MG/ML SOLN  COMPARISON:  10/07/2009 abdominal pelvic CT. Chest CT of 02/19/2013. Chest radiograph of earlier today.  FINDINGS: CTA CHEST FINDINGS  Lungs/Pleura: Mild motion degradation. Scattered areas of bibasilar scarring or subsegmental atelectasis. No right-sided pulmonary nodule to correspond to the questioned plain film abnormality. No lobar consolidation.  No pleural fluid.  Heart/Mediastinum: The quality of this examination for evaluation of pulmonary embolism is good. No evidence of pulmonary embolism. Pulmonary artery enlargement, with the outflow tract measuring 3.8 cm.  Dense aortic and branch vessel atherosclerosis. Moderate cardiomegaly with coronary artery atherosclerosis. No mediastinal or hilar adenopathy.  CT ABDOMEN and PELVIS FINDINGS  Hepatobiliary: Scattered tiny hepatic lesions are likely cysts. Normal gallbladder, without biliary ductal dilatation.  Pancreas: No pancreatic ductal dilatation or evidence of acute  pancreatitis. Suspect is cystic lesion within the pancreatic head at 1.4 cm on image 31 of series 11. This may have been present on the prior and is suboptimally evaluated on that study.  Spleen: Normal  Adrenals/Urinary Tract: Normal adrenal glands. Mild renal cortical thinning. Interpolar left renal cyst. Bilateral too small to characterize renal lesions. No hydronephrosis. Normal urinary bladder.  Stomach/Bowel: Underdistended stomach. Apparent proximal gastric wall thickening could be secondary. Example image 20. Normal colon and terminal ileum. Appendix is not visualized but there is no evidence of right lower quadrant inflammation.  Vascular/Lymphatic: Aortic and branch vessel  atherosclerosis. No retroperitoneal or retrocrural adenopathy.  Reproductive: Normal uterus, without adnexal mass.  Other: Fat containing left inguinal hernia. No significant free fluid.  Musculoskeletal: Moderate osteopenia. Moderate convex left lumbar spine curvature. Advanced thoracolumbar spondylosis.  Review of the MIP images confirms the above findings.  IMPRESSION: 1.  No evidence of pulmonary embolism. 2. Pulmonary artery enlargement suggests pulmonary arterial hypertension. 3. No evidence of right-sided pulmonary nodule. 4. Gastric underdistention. Apparent wall thickening could be secondary. Gastritis cannot be excluded. 5. No other explanation for pain. 6. Suspicion of a cystic lesion in the pancreatic head. This is of doubtful clinical significance, given patient age. Most likely a pseudocyst.   Electronically Signed   By: Jeronimo Greaves M.D.   On: 11/09/2014 00:11   Dg Chest Port 1 View  11/08/2014   CLINICAL DATA:  Chest pain and hypertension.  EXAM: PORTABLE CHEST - 1 VIEW  COMPARISON:  09/12/2008, 02/19/2013  FINDINGS: The heart is enlarged. There is perihilar peribronchial thickening. Bronchiectasis is noted at the right lung base. There is question of a right lower lobe or right middle lobe nodule warranting further evaluation with CT exam. There are no focal consolidations or definite pleural effusions. No pulmonary edema.  IMPRESSION: 1. Bronchitic changes and right lower lobe bronchiectasis. 2. Question of right lower lobe/right middle lobe lung nodule. Further evaluation with CT of chest with contrast is recommended.   Electronically Signed   By: Rosalie Gums M.D.   On: 11/08/2014 19:28    Microbiology: Recent Results (from the past 240 hour(s))  Clostridium Difficile by PCR     Status: None   Collection Time: 11/09/14  1:39 AM  Result Value Ref Range Status   C difficile by pcr NEGATIVE NEGATIVE Final     Labs: Basic Metabolic Panel:  Recent Labs Lab 11/08/14 1829  11/09/14 0406 11/10/14 0440  NA 136 135 138  K 3.9 3.7 3.5  CL 104 105 102  CO2 23 25 22   GLUCOSE 106* 87 94  BUN 28* 22 21  CREATININE 1.20* 1.05 1.17*  CALCIUM 8.9 8.8 9.3   Liver Function Tests:  Recent Labs Lab 11/08/14 1829 11/09/14 0406  AST 24 25  ALT 17 23  ALKPHOS 122* 98  BILITOT 0.2* 0.5  PROT 6.1 5.7*  ALBUMIN 3.5 3.1*    Recent Labs Lab 11/08/14 1829  LIPASE 27   No results for input(s): AMMONIA in the last 168 hours. CBC:  Recent Labs Lab 11/08/14 1829 11/09/14 0406 11/10/14 0440  WBC 4.8 4.6 5.8  NEUTROABS 2.7 2.4  --   HGB 10.6* 10.0* 11.0*  HCT 33.2* 31.4* 34.4*  MCV 89.7 89.0 89.6  PLT 253 238 264   Cardiac Enzymes:  Recent Labs Lab 11/09/14 0406 11/09/14 0926  TROPONINI <0.03 <0.03   BNP: BNP (last 3 results) No results for input(s): PROBNP in the last 8760 hours. CBG: No  results for input(s): GLUCAP in the last 168 hours.     SignedMarlin Canary  [facility] 11/10/2014, 10:06 AM

## 2014-11-12 LAB — URINE CULTURE: Colony Count: >=100000

## 2014-12-26 ENCOUNTER — Encounter (HOSPITAL_COMMUNITY): Payer: Self-pay | Admitting: Physical Medicine and Rehabilitation

## 2014-12-26 ENCOUNTER — Emergency Department (HOSPITAL_COMMUNITY): Payer: Medicare Other

## 2014-12-26 ENCOUNTER — Inpatient Hospital Stay (HOSPITAL_COMMUNITY)
Admission: EM | Admit: 2014-12-26 | Discharge: 2014-12-30 | DRG: 391 | Disposition: A | Discharge status: Skilled Nursing Facility | Payer: Medicare Other | Attending: Internal Medicine | Admitting: Internal Medicine

## 2014-12-26 DIAGNOSIS — Z9181 History of falling: Secondary | ICD-10-CM

## 2014-12-26 DIAGNOSIS — Z66 Do not resuscitate: Secondary | ICD-10-CM | POA: Diagnosis present

## 2014-12-26 DIAGNOSIS — M1990 Unspecified osteoarthritis, unspecified site: Secondary | ICD-10-CM | POA: Diagnosis present

## 2014-12-26 DIAGNOSIS — H9190 Unspecified hearing loss, unspecified ear: Secondary | ICD-10-CM | POA: Diagnosis present

## 2014-12-26 DIAGNOSIS — R1010 Upper abdominal pain, unspecified: Secondary | ICD-10-CM | POA: Diagnosis present

## 2014-12-26 DIAGNOSIS — D649 Anemia, unspecified: Secondary | ICD-10-CM | POA: Diagnosis present

## 2014-12-26 DIAGNOSIS — Z7982 Long term (current) use of aspirin: Secondary | ICD-10-CM | POA: Diagnosis not present

## 2014-12-26 DIAGNOSIS — N390 Urinary tract infection, site not specified: Secondary | ICD-10-CM | POA: Diagnosis present

## 2014-12-26 DIAGNOSIS — J189 Pneumonia, unspecified organism: Secondary | ICD-10-CM

## 2014-12-26 DIAGNOSIS — E780 Pure hypercholesterolemia: Secondary | ICD-10-CM | POA: Diagnosis present

## 2014-12-26 DIAGNOSIS — F329 Major depressive disorder, single episode, unspecified: Secondary | ICD-10-CM | POA: Diagnosis present

## 2014-12-26 DIAGNOSIS — Z888 Allergy status to other drugs, medicaments and biological substances status: Secondary | ICD-10-CM | POA: Diagnosis not present

## 2014-12-26 DIAGNOSIS — Z87442 Personal history of urinary calculi: Secondary | ICD-10-CM

## 2014-12-26 DIAGNOSIS — F419 Anxiety disorder, unspecified: Secondary | ICD-10-CM | POA: Diagnosis present

## 2014-12-26 DIAGNOSIS — K5900 Constipation, unspecified: Secondary | ICD-10-CM | POA: Diagnosis present

## 2014-12-26 DIAGNOSIS — K219 Gastro-esophageal reflux disease without esophagitis: Secondary | ICD-10-CM | POA: Diagnosis present

## 2014-12-26 DIAGNOSIS — I2510 Atherosclerotic heart disease of native coronary artery without angina pectoris: Secondary | ICD-10-CM | POA: Diagnosis present

## 2014-12-26 DIAGNOSIS — J159 Unspecified bacterial pneumonia: Secondary | ICD-10-CM | POA: Diagnosis present

## 2014-12-26 DIAGNOSIS — Z955 Presence of coronary angioplasty implant and graft: Secondary | ICD-10-CM

## 2014-12-26 DIAGNOSIS — R109 Unspecified abdominal pain: Secondary | ICD-10-CM

## 2014-12-26 DIAGNOSIS — H547 Unspecified visual loss: Secondary | ICD-10-CM | POA: Diagnosis present

## 2014-12-26 DIAGNOSIS — R0600 Dyspnea, unspecified: Secondary | ICD-10-CM

## 2014-12-26 DIAGNOSIS — H543 Unqualified visual loss, both eyes: Secondary | ICD-10-CM | POA: Diagnosis present

## 2014-12-26 DIAGNOSIS — Z886 Allergy status to analgesic agent status: Secondary | ICD-10-CM | POA: Diagnosis not present

## 2014-12-26 DIAGNOSIS — I10 Essential (primary) hypertension: Secondary | ICD-10-CM | POA: Diagnosis present

## 2014-12-26 HISTORY — DX: Angina pectoris, unspecified: I20.9

## 2014-12-26 HISTORY — DX: Gastro-esophageal reflux disease without esophagitis: K21.9

## 2014-12-26 HISTORY — DX: Other chronic pain: G89.29

## 2014-12-26 HISTORY — DX: Headache, unspecified: R51.9

## 2014-12-26 HISTORY — DX: Personal history of other diseases of the digestive system: Z87.19

## 2014-12-26 HISTORY — DX: Pure hypercholesterolemia, unspecified: E78.00

## 2014-12-26 HISTORY — DX: Atherosclerotic heart disease of native coronary artery without angina pectoris: I25.10

## 2014-12-26 HISTORY — DX: Low back pain: M54.5

## 2014-12-26 HISTORY — DX: Headache: R51

## 2014-12-26 HISTORY — DX: Pneumonia, unspecified organism: J18.9

## 2014-12-26 HISTORY — DX: Low back pain, unspecified: M54.50

## 2014-12-26 HISTORY — DX: Cardiac murmur, unspecified: R01.1

## 2014-12-26 HISTORY — DX: Trichiasis without entropion unspecified eye, unspecified eyelid: H02.059

## 2014-12-26 LAB — I-STAT CHEM 8, ED
BUN: 28 mg/dL — AB (ref 6–23)
Calcium, Ion: 1.18 mmol/L (ref 1.13–1.30)
Chloride: 103 mmol/L (ref 96–112)
Creatinine, Ser: 1 mg/dL (ref 0.50–1.10)
GLUCOSE: 139 mg/dL — AB (ref 70–99)
HEMATOCRIT: 37 % (ref 36.0–46.0)
HEMOGLOBIN: 12.6 g/dL (ref 12.0–15.0)
Potassium: 3.7 mmol/L (ref 3.5–5.1)
Sodium: 137 mmol/L (ref 135–145)
TCO2: 20 mmol/L (ref 0–100)

## 2014-12-26 LAB — COMPREHENSIVE METABOLIC PANEL
ALK PHOS: 119 U/L — AB (ref 39–117)
ALT: 19 U/L (ref 0–35)
ANION GAP: 10 (ref 5–15)
AST: 22 U/L (ref 0–37)
Albumin: 3.7 g/dL (ref 3.5–5.2)
BILIRUBIN TOTAL: 1.1 mg/dL (ref 0.3–1.2)
BUN: 27 mg/dL — ABNORMAL HIGH (ref 6–23)
CHLORIDE: 104 mmol/L (ref 96–112)
CO2: 23 mmol/L (ref 19–32)
CREATININE: 1.18 mg/dL — AB (ref 0.50–1.10)
Calcium: 9.5 mg/dL (ref 8.4–10.5)
GFR calc non Af Amer: 37 mL/min — ABNORMAL LOW (ref >90)
GFR, EST AFRICAN AMERICAN: 43 mL/min — AB (ref >90)
Glucose, Bld: 137 mg/dL — ABNORMAL HIGH (ref 70–99)
POTASSIUM: 3.7 mmol/L (ref 3.5–5.1)
Sodium: 137 mmol/L (ref 135–145)
Total Protein: 6.9 g/dL (ref 6.0–8.3)

## 2014-12-26 LAB — POC OCCULT BLOOD, ED: Fecal Occult Bld: NEGATIVE

## 2014-12-26 LAB — URINALYSIS, ROUTINE W REFLEX MICROSCOPIC
Bilirubin Urine: NEGATIVE
GLUCOSE, UA: NEGATIVE mg/dL
KETONES UR: NEGATIVE mg/dL
Leukocytes, UA: NEGATIVE
Nitrite: POSITIVE — AB
Protein, ur: NEGATIVE mg/dL
Specific Gravity, Urine: 1.036 — ABNORMAL HIGH (ref 1.005–1.030)
Urobilinogen, UA: 1 mg/dL (ref 0.0–1.0)
pH: 5.5 (ref 5.0–8.0)

## 2014-12-26 LAB — CBC WITH DIFFERENTIAL/PLATELET
BASOS ABS: 0 10*3/uL (ref 0.0–0.1)
BASOS PCT: 0 % (ref 0–1)
EOS PCT: 0 % (ref 0–5)
Eosinophils Absolute: 0 10*3/uL (ref 0.0–0.7)
HCT: 33.4 % — ABNORMAL LOW (ref 36.0–46.0)
HEMOGLOBIN: 11 g/dL — AB (ref 12.0–15.0)
Lymphocytes Relative: 3 % — ABNORMAL LOW (ref 12–46)
Lymphs Abs: 0.4 10*3/uL — ABNORMAL LOW (ref 0.7–4.0)
MCH: 28.5 pg (ref 26.0–34.0)
MCHC: 32.9 g/dL (ref 30.0–36.0)
MCV: 86.5 fL (ref 78.0–100.0)
Monocytes Absolute: 0.7 10*3/uL (ref 0.1–1.0)
Monocytes Relative: 5 % (ref 3–12)
Neutro Abs: 12.8 10*3/uL — ABNORMAL HIGH (ref 1.7–7.7)
Neutrophils Relative %: 92 % — ABNORMAL HIGH (ref 43–77)
Platelets: 248 10*3/uL (ref 150–400)
RBC: 3.86 MIL/uL — ABNORMAL LOW (ref 3.87–5.11)
RDW: 14.3 % (ref 11.5–15.5)
WBC: 13.9 10*3/uL — ABNORMAL HIGH (ref 4.0–10.5)

## 2014-12-26 LAB — URINE MICROSCOPIC-ADD ON

## 2014-12-26 LAB — I-STAT TROPONIN, ED: Troponin i, poc: 0.01 ng/mL (ref 0.00–0.08)

## 2014-12-26 LAB — I-STAT CG4 LACTIC ACID, ED: Lactic Acid, Venous: 1.71 mmol/L (ref 0.5–2.0)

## 2014-12-26 LAB — LIPASE, BLOOD: Lipase: 19 U/L (ref 11–59)

## 2014-12-26 MED ORDER — ONDANSETRON HCL 4 MG/2ML IJ SOLN
4.0000 mg | Freq: Once | INTRAMUSCULAR | Status: AC
  Administered 2014-12-26: 4 mg via INTRAVENOUS
  Filled 2014-12-26: qty 2

## 2014-12-26 MED ORDER — IOHEXOL 300 MG/ML  SOLN
25.0000 mL | INTRAMUSCULAR | Status: AC
  Administered 2014-12-26: 25 mL via ORAL

## 2014-12-26 MED ORDER — VANCOMYCIN HCL IN DEXTROSE 1-5 GM/200ML-% IV SOLN
1000.0000 mg | Freq: Once | INTRAVENOUS | Status: AC
  Administered 2014-12-26: 1000 mg via INTRAVENOUS
  Filled 2014-12-26: qty 200

## 2014-12-26 MED ORDER — SUCRALFATE 1 GM/10ML PO SUSP
1.0000 g | Freq: Three times a day (TID) | ORAL | Status: DC
  Administered 2014-12-26 (×2): 1 g via ORAL
  Filled 2014-12-26 (×2): qty 10

## 2014-12-26 MED ORDER — ESCITALOPRAM OXALATE 10 MG PO TABS
10.0000 mg | ORAL_TABLET | Freq: Every day | ORAL | Status: DC
  Administered 2014-12-27 – 2014-12-30 (×4): 10 mg via ORAL
  Filled 2014-12-26 (×5): qty 1

## 2014-12-26 MED ORDER — LOSARTAN POTASSIUM 50 MG PO TABS
50.0000 mg | ORAL_TABLET | Freq: Every day | ORAL | Status: DC
  Administered 2014-12-26 – 2014-12-30 (×5): 50 mg via ORAL
  Filled 2014-12-26 (×5): qty 1

## 2014-12-26 MED ORDER — ENOXAPARIN SODIUM 40 MG/0.4ML ~~LOC~~ SOLN
40.0000 mg | SUBCUTANEOUS | Status: DC
  Administered 2014-12-26 – 2014-12-29 (×4): 40 mg via SUBCUTANEOUS
  Filled 2014-12-26 (×4): qty 0.4

## 2014-12-26 MED ORDER — ARTIFICIAL TEARS OP OINT
1.0000 "application " | TOPICAL_OINTMENT | Freq: Every day | OPHTHALMIC | Status: DC
  Administered 2014-12-28 – 2014-12-29 (×2): 1 via OPHTHALMIC
  Filled 2014-12-26 (×2): qty 3.5

## 2014-12-26 MED ORDER — POLYETHYLENE GLYCOL 3350 17 G PO PACK: 17.0000 g | PACK | Freq: Every day | ORAL | Status: DC | PRN

## 2014-12-26 MED ORDER — ISOSORBIDE MONONITRATE ER 30 MG PO TB24
30.0000 mg | ORAL_TABLET | Freq: Every day | ORAL | Status: DC
  Administered 2014-12-26 – 2014-12-30 (×5): 30 mg via ORAL
  Filled 2014-12-26 (×5): qty 1

## 2014-12-26 MED ORDER — WHITE PETROLATUM GEL
Status: DC | PRN
  Administered 2014-12-26: 0.2 via TOPICAL
  Administered 2014-12-28: 22:00:00 via TOPICAL
  Administered 2014-12-29: 0.2 via TOPICAL
  Filled 2014-12-26: qty 28.35
  Filled 2014-12-26: qty 1

## 2014-12-26 MED ORDER — CALCITONIN (SALMON) 200 UNIT/ACT NA SOLN
1.0000 | Freq: Every day | NASAL | Status: DC
  Administered 2014-12-27 – 2014-12-30 (×2): 1 via NASAL
  Filled 2014-12-26 (×2): qty 3.7

## 2014-12-26 MED ORDER — DEXTROSE 5 % IV SOLN
2.0000 g | Freq: Once | INTRAVENOUS | Status: AC
  Administered 2014-12-26: 2 g via INTRAVENOUS
  Filled 2014-12-26: qty 2

## 2014-12-26 MED ORDER — DILTIAZEM HCL ER BEADS 300 MG PO CP24
300.0000 mg | ORAL_CAPSULE | Freq: Every day | ORAL | Status: DC
  Administered 2014-12-26 – 2014-12-30 (×5): 300 mg via ORAL
  Filled 2014-12-26 (×6): qty 1

## 2014-12-26 MED ORDER — TRAMADOL HCL 50 MG PO TABS
50.0000 mg | ORAL_TABLET | Freq: Every day | ORAL | Status: DC
Start: 2014-12-26 — End: 2014-12-27
  Administered 2014-12-26: 50 mg via ORAL
  Filled 2014-12-26: qty 1

## 2014-12-26 MED ORDER — ASPIRIN EC 81 MG PO TBEC
81.0000 mg | DELAYED_RELEASE_TABLET | Freq: Every day | ORAL | Status: DC
  Administered 2014-12-26 – 2014-12-30 (×5): 81 mg via ORAL
  Filled 2014-12-26 (×5): qty 1

## 2014-12-26 MED ORDER — MORPHINE SULFATE 2 MG/ML IJ SOLN: 2.0000 mg | INTRAMUSCULAR | Status: DC | PRN

## 2014-12-26 MED ORDER — ALBUTEROL SULFATE (2.5 MG/3ML) 0.083% IN NEBU: 2.5000 mg | INHALATION_SOLUTION | RESPIRATORY_TRACT | Status: DC | PRN

## 2014-12-26 MED ORDER — IOHEXOL 300 MG/ML  SOLN
80.0000 mL | Freq: Once | INTRAMUSCULAR | Status: AC | PRN
  Administered 2014-12-26: 1 mL via INTRAVENOUS

## 2014-12-26 MED ORDER — MORPHINE SULFATE 2 MG/ML IJ SOLN
2.0000 mg | INTRAMUSCULAR | Status: DC | PRN
  Administered 2014-12-26 – 2014-12-27 (×2): 2 mg via INTRAVENOUS
  Filled 2014-12-26 (×2): qty 1

## 2014-12-26 MED ORDER — ALPRAZOLAM 0.25 MG PO TABS
0.2500 mg | ORAL_TABLET | Freq: Every day | ORAL | Status: DC | PRN
  Administered 2014-12-29: 0.25 mg via ORAL
  Filled 2014-12-26: qty 1

## 2014-12-26 MED ORDER — DEXTROSE 5 % IV SOLN
1.0000 g | INTRAVENOUS | Status: DC
  Administered 2014-12-27: 1 g via INTRAVENOUS
  Filled 2014-12-26 (×2): qty 1

## 2014-12-26 MED ORDER — DOCUSATE SODIUM 100 MG PO CAPS
100.0000 mg | ORAL_CAPSULE | Freq: Every day | ORAL | Status: DC
  Administered 2014-12-26 – 2014-12-30 (×5): 100 mg via ORAL
  Filled 2014-12-26 (×5): qty 1

## 2014-12-26 MED ORDER — PANTOPRAZOLE SODIUM 40 MG PO TBEC
40.0000 mg | DELAYED_RELEASE_TABLET | Freq: Every day | ORAL | Status: DC
  Administered 2014-12-26: 40 mg via ORAL
  Filled 2014-12-26: qty 1

## 2014-12-26 MED ORDER — MORPHINE SULFATE 4 MG/ML IJ SOLN
4.0000 mg | INTRAMUSCULAR | Status: AC | PRN
  Administered 2014-12-26 – 2014-12-27 (×3): 4 mg via INTRAVENOUS
  Filled 2014-12-26 (×3): qty 1

## 2014-12-26 MED ORDER — VANCOMYCIN HCL 500 MG IV SOLR
500.0000 mg | INTRAVENOUS | Status: DC
  Administered 2014-12-27: 500 mg via INTRAVENOUS
  Filled 2014-12-26 (×3): qty 500

## 2014-12-26 MED ORDER — PREDNISOLONE ACETATE 1 % OP SUSP
1.0000 [drp] | Freq: Every day | OPHTHALMIC | Status: DC
  Administered 2014-12-26 – 2014-12-30 (×5): 1 [drp] via OPHTHALMIC
  Filled 2014-12-26 (×2): qty 5

## 2014-12-26 NOTE — ED Notes (Signed)
Pt crying from discomfort, daughter at bedside

## 2014-12-26 NOTE — ED Provider Notes (Signed)
CSN: [id number]     Arrival date & time 12/26/14  4098 History   First MD Initiated Contact with Patient 12/26/14 [phone]     Chief Complaint  Patient presents with  . Abdominal Pain  . Nausea      HPI  She presents for evaluation of abdominal pain. She lives in independent apartment at [facility]. She called her daughter this morning complaining of abdominal pain stating "I think I have a blockage". Daughter drove to her mother's apartment. She called 911 in route. Patient transported by EMS here complaining of epigastric, LUQ,  and periumbilical abdominal pain.  Nausea no vomiting. Pain started yesterday. She states the last time she had a bowel movement "natural" was a week ago. She had a small bowel movement yesterday "straining". States she had to sit on the toilet for length of time. Occasionally will note streaks of blood in her urine. No frank bright red blood per rectum or melena.  Admitted for abdominal pain and December 2015 had a CT scan showing no acute abnormalities.  Past Medical History  Diagnosis Date  . Hypertension   . Hearing loss     "both ears"  . Vision loss, bilateral   . High cholesterol   . Heart murmur   . Anginal pain   . Coronary artery disease   . GERD (gastroesophageal reflux disease)   . History of hiatal hernia   . Headache     "weekly" (12/26/2014)  . Arthritis     "all over" (12/26/2014)  . Chronic lower back pain   . Kidney stone     Patient has distention on the right side from scar tissue from a past embedded kidney stone.   . Trichiasis     "of lashes; gets this done q 3 months or so" (12/26/2014)  . HCAP (healthcare-associated pneumonia) 12/26/2014   Past Surgical History  Procedure Laterality Date  . Kidney stone surgery  1970's    "imbedded stone; had to cut her to get it out"  . Coronary angioplasty with stent placement  ~ 1998; ~ 2002    "1; 1"   . Dilation and curettage of uterus    . Entropian repair Right 01/01/2004    "lower  eyelid"  . Ectropion repair Left 11/06/03    "lower eyelid"  . Entropian repair Bilateral     "lower"  . Punctoplasty Bilateral     "lower eyelids"  . Cataract extraction w/ intraocular lens implant Bilateral 2000's  . Eye surgery Bilateral 2000's    "to prevent lashes growing inward; had upper and lower on one side; upper only on the other; has to have eyelashes plucked q 3 months or so" (12/26/2014(  . Eye surgery Bilateral     repair retractor   History reviewed. No pertinent family history. History  Substance Use Topics  . Smoking status: Never Smoker   . Smokeless tobacco: Never Used  . Alcohol Use: No   OB History    No data available     Review of Systems  Constitutional: Negative for fever, chills, diaphoresis, appetite change and fatigue.  HENT: Negative for mouth sores, sore throat and trouble swallowing.   Eyes: Negative for visual disturbance.  Respiratory: Negative for cough, chest tightness, shortness of breath and wheezing.   Cardiovascular: Negative for chest pain.  Gastrointestinal: Positive for nausea and abdominal pain. Negative for vomiting, diarrhea and abdominal distention.  Endocrine: Negative for polydipsia, polyphagia and polyuria.  Genitourinary: Negative for dysuria, frequency  and hematuria.  Musculoskeletal: Negative for gait problem.  Skin: Negative for color change, pallor and rash.  Neurological: Negative for dizziness, syncope, light-headedness and headaches.  Hematological: Does not bruise/bleed easily.  Psychiatric/Behavioral: Negative for behavioral problems and confusion.      Allergies  Codeine and Novocain  Home Medications   Prior to Admission medications   Medication Sig Start Date End Date Taking? Authorizing Provider  ALPRAZolam (XANAX) 0.25 MG tablet Take 1 tablet (0.25 mg total) by mouth daily as needed for sleep or anxiety. 02/21/13  Yes Minda Meoichard A Aronson, MD  Artificial Tear Ointment (ARTIFICIAL TEARS) ointment Place 1  application into both eyes at bedtime.   Yes Historical Provider, MD  aspirin EC 81 MG EC tablet Take 1 tablet (81 mg total) by mouth daily. 02/21/13  Yes Minda Meoichard A Aronson, MD  diltiazem [facility](TIAZAC) 300 MG 24 hr capsule Take 300 mg by mouth daily.   Yes Historical Provider, MD  docusate sodium (COLACE) 100 MG capsule Take 100 mg by mouth daily.    Yes Historical Provider, MD  escitalopram (LEXAPRO) 10 MG tablet Take 10 mg by mouth daily. 10/13/14  Yes Historical Provider, MD  HORSE CHESTNUT PO Take 1 capsule by mouth at bedtime. For leg pain   Yes Historical Provider, MD  isosorbide mononitrate (IMDUR) 30 MG 24 hr tablet Take 30 mg by mouth daily.   Yes Historical Provider, MD  losartan (COZAAR) 50 MG tablet Take 50 mg by mouth daily. 09/22/14  Yes Historical Provider, MD  omeprazole (PRILOSEC) 40 MG capsule Take 40 mg by mouth daily.   Yes Historical Provider, MD  polyethylene glycol (MIRALAX / GLYCOLAX) packet Take 17 g by mouth daily as needed. 11/10/14  Yes Joseph ArtJessica U Vann, DO  prednisoLONE acetate (PRED FORTE) 1 % ophthalmic suspension Place 1 drop into both eyes daily.   Yes Historical Provider, MD  traMADol (ULTRAM) 50 MG tablet Take 50 mg by mouth at bedtime.   Yes Historical Provider, MD  triamterene-hydrochlorothiazide (MAXZIDE-25) 37.5-25 MG per tablet Take 1 tablet by mouth daily. 10/28/14  Yes Historical Provider, MD  calcitonin, salmon, (MIACALCIN/FORTICAL) 200 UNIT/ACT nasal spray Place 1 spray into the nose daily. Patient not taking: Reported on 12/26/2014 02/21/13   Minda Meoichard A Aronson, MD  carisoprodol (SOMA) 350 MG tablet Take 1 tablet (350 mg total) by mouth 3 (three) times daily as needed (spasm). Patient not taking: Reported on 12/26/2014 02/21/13   Minda Meoichard A Aronson, MD  sucralfate (CARAFATE) 1 GM/10ML suspension Take 10 mLs (1 g total) by mouth 4 (four) times daily -  with meals and at bedtime. Patient not taking: Reported on 12/26/2014 11/10/14   Selinda OrionJessica U Vann, DO   BP 101/46 mmHg   Pulse 108  Temp(Src) 99.1 F (37.3 C) (Oral)  Resp 18  SpO2 94% Physical Exam  Constitutional: She is oriented to person, place, and time. She appears well-developed and well-nourished. No distress.  HENT:  Head: Normocephalic.  Eyes: Conjunctivae are normal. Pupils are equal, round, and reactive to light. No scleral icterus.  Neck: Normal range of motion. Neck supple. No thyromegaly present.  Cardiovascular: Normal rate and regular rhythm.  Exam reveals no gallop and no friction rub.   No murmur heard. Pulmonary/Chest: Effort normal and breath sounds normal. No respiratory distress. She has no wheezes. She has no rales.  Abdominal: Soft. Bowel sounds are normal. She exhibits no distension. There is tenderness. There is no rebound.    Rectal exam shows no fecal impaction. Guaiac  negative brown stool.  Musculoskeletal: Normal range of motion.  Neurological: She is alert and oriented to person, place, and time.  Skin: Skin is warm and dry. No rash noted.  Psychiatric: She has a normal mood and affect. Her behavior is normal.    ED Course  Procedures (including critical care time) Labs Review Labs Reviewed  CBC WITH DIFFERENTIAL/PLATELET - Abnormal; Notable for the following:    WBC 13.9 (*)    RBC 3.86 (*)    Hemoglobin 11.0 (*)    HCT 33.4 (*)    Neutrophils Relative % 92 (*)    Neutro Abs 12.8 (*)    Lymphocytes Relative 3 (*)    Lymphs Abs 0.4 (*)    All other components within normal limits  COMPREHENSIVE METABOLIC PANEL - Abnormal; Notable for the following:    Glucose, Bld 137 (*)    BUN 27 (*)    Creatinine, Ser 1.18 (*)    Alkaline Phosphatase 119 (*)    GFR calc non Af Amer 37 (*)    GFR calc Af Amer 43 (*)    All other components within normal limits  URINALYSIS, ROUTINE W REFLEX MICROSCOPIC - Abnormal; Notable for the following:    Specific Gravity, Urine 1.036 (*)    Hgb urine dipstick TRACE (*)    Nitrite POSITIVE (*)    All other components within normal  limits  URINE MICROSCOPIC-ADD ON - Abnormal; Notable for the following:    Squamous Epithelial / LPF FEW (*)    Bacteria, UA MANY (*)    All other components within normal limits  COMPREHENSIVE METABOLIC PANEL - Abnormal; Notable for the following:    Glucose, Bld 101 (*)    BUN 24 (*)    Creatinine, Ser 1.11 (*)    Total Protein 5.4 (*)    Albumin 2.7 (*)    GFR calc non Af Amer 40 (*)    GFR calc Af Amer 47 (*)    Anion gap 4 (*)    All other components within normal limits  CBC - Abnormal; Notable for the following:    WBC 11.5 (*)    RBC 3.16 (*)    Hemoglobin 9.1 (*)    HCT 27.9 (*)    All other components within normal limits  I-STAT CHEM 8, ED - Abnormal; Notable for the following:    BUN 28 (*)    Glucose, Bld 139 (*)    All other components within normal limits  CULTURE, BLOOD (ROUTINE X 2)  CULTURE, BLOOD (ROUTINE X 2)  URINE CULTURE  LIPASE, BLOOD  I-STAT CG4 LACTIC ACID, ED  I-STAT TROPOININ, ED  POC OCCULT BLOOD, ED    Imaging Review US Abdomen Complete  12/27/2014   CLINICAL DATA:  Abdominal discomfort  EXAM: ULTRASOUND ABDOMEN COMPLETE  COMPARISON:  Abdominal CT from yesterday  FINDINGS: Gallbladder: The gallbladder is distended but there is no wall thickening, focal tenderness, or stone to suggest obstruction and acute cholecystitis.  Common bile duct: Diameter: 5 mm  Liver: Mild intrahepatic biliary ductal dilatation, likely chronic based on previous CT. There is no evidence of biliary obstruction on complete metabolic panel. No evidence of focal mass lesion. A portovenous shunt in the left liver was not visualized.  IVC: No abnormality visualized.  Pancreas: Essentially not visualized.  Spleen: Size and appearance within normal limits.  Right Kidney: Length: 9 cm. Renal length loss and diffuse cortical thinning consistent with atrophy. No hydronephrosis or mass lesion.  Left  Kidney: Length: 9 cm. Renal length loss and diffuse cortical thinning consistent with  atrophy. No hydronephrosis or solid mass lesion. There is a 2.7 cm simple cyst.  Abdominal aorta: The mid and distal aorta is not visualized due to bowel gas. No aneurysm noted on recent abdominal CT.  Other findings: Trace right pleural effusion.  IMPRESSION: 1. No explanation for acute abdominal pain. 2. Trace right pleural effusion. 3. The pancreas is not visible due to bowel gas.   Electronically Signed   By: Marnee Spring M.D.   On: 12/27/2014 09:18   Ct Abdomen Pelvis W Contrast  12/26/2014   CLINICAL DATA:  81 year old female with diffuse abdominal pain nausea and constipation. Intermittent rectal bleeding. Initial encounter.  EXAM: CT ABDOMEN AND PELVIS WITH CONTRAST  TECHNIQUE: Multidetector CT imaging of the abdomen and pelvis was performed using the standard protocol following bolus administration of intravenous contrast.  CONTRAST:  1mL OMNIPAQUE IOHEXOL 300 MG/ML  SOLN  COMPARISON:  Chest CTA and CT Abdomen and Pelvis 11/08/2014.  FINDINGS: Stable cardiomegaly. New patchy opacity in the left lower lobe at the costophrenic angle, series 3, image 7. Mild dependent atelectasis on the right. No pericardial or pleural effusion.  Advanced degenerative changes in the spine. No acute osseous abnormality identified.  Negative uterus and adnexa. No pelvic free fluid. Increased stool in the rectum. Redundant sigmoid colon with diverticulosis. No definite sigmoid active inflammation. Redundant left colon with retained stool. Oral contrast has reached the distal transverse colon. Negative hepatic flexure. Negative ascending colon.  At the cecum there is suggestion of wall thickening along the medial wall and at the ileocecal valve (series 2, image 46), but this area was completely normal on 11/08/2014. There is no surrounding mesenteric stranding or lymphadenopathy. Legrand Rams this is adherent stool.  No dilated small bowel. Decompressed stomach similar in appearance to that in December. Negative duodenum.  Stable  liver and gallbladder. No pericholecystic inflammation. Stable appearance of the pancreas, including a small cystic area in the pancreatic head. No abdominal free fluid. Negative spleen and adrenal glands. Stable kidneys, with symmetric renal contrast excretion.  Portal venous system is patent. Extensive Aortoiliac calcified atherosclerosis noted. Major arterial structures in the abdomen and pelvis are patent. No lymphadenopathy.  IMPRESSION: 1. No inflammatory process identified in the abdomen. Appearance of the ileocecal valve and medial cecum felt reflect adherent stool (was normal in December). Sigmoid diverticulosis without active inflammation identified. Increased retained stool from the prior study. 2. Abnormal patchy left lower lobe opacity new since December and highly suspicious for aspiration or pneumonia.   Electronically Signed   By: Odessa Fleming M.D.   On: 12/26/2014 12:42   Dg Chest Port 1 View  12/26/2014   CLINICAL DATA:  81 year old female with extreme abdominal pain and shortness of breath since this morning. Initial encounter.  EXAM: PORTABLE CHEST - 1 VIEW  COMPARISON:  Chest CTA 11/08/2014 and earlier.  FINDINGS: Portable AP semi upright view at at 1015 hours. Increased pulmonary vascularity. Stable cardiac size and mediastinal contours. No pneumothorax. No pleural effusion or consolidation. Chronic lingula atelectasis or scarring. No pneumoperitoneum identified. No bowel gas in the upper abdomen.  IMPRESSION: Increased pulmonary vascular congestion, consider interstitial edema.   Electronically Signed   By: Odessa Fleming M.D.   On: 12/26/2014 10:27     EKG Interpretation   Date/Time:  Tuesday December 26 2014 09:40:00 EST Ventricular Rate:  80 PR Interval:  224 QRS Duration: 97 QT Interval:  414 QTC  Calculation: 478 R Axis:   -41 Text Interpretation:  Sinus rhythm Prolonged PR interval Left atrial  enlargement Left axis deviation Borderline T abnormalities, anterior leads  Confirmed by  Fayrene Fearing  MD, Crysten Kaman ([id number]) on 12/26/2014 9:56:28 AM      MDM   Final diagnoses:  Dyspnea  Pneumonia, organism unspecified    CT shows no acute process. Some areas retained stool but not markedly distended. No obstruction. No localized signs of inflammation noncontrast CT. Study confirms an infiltrate in the left base/diaphragm. On recheck she is requiring on 2 L of nasal cannula O2. Still intermittent leg complaining of discomfort. Has no peritoneal abdomen. I've asked for blood cultures and IV Ativan. I placed a call to her PCP,  Dr. Jacky Kindle in [facility] medical.    Rolland Porter, MD 12/27/14 [phone]

## 2014-12-26 NOTE — H&P (Signed)
PCP:   Minda Meo, MD   Chief Complaint:   Abdominal pain  HPI: 81YO with CAD, recent CP admission. Called her dtr this AM to report "a blockage". Had a very tender and painful abdomen. Able to eat normally last night. No N/V. No worsening of pain with food. Bowels a bit slow but working. No f/c/s. Eval in ER suggests a low LLL pneumonia that perhaps could be causing referred pain. Some SOB. No CP. No skin rash/prior had Zostavax. Also noted to have a UTI Now doesn't feel hungry. Pain is 70% better but still can't bear to have anyone touch her upper quadrants bilaterally.   Review of Systems:  Review of Systems - as above Past Medical History: Past Medical History  Diagnosis Date  . Hypertension   . Hx of heart artery stent   . Arthritis   . Hearing loss   . Vision loss, bilateral   . Kidney stone     Patient has distention on the right side from scar tissue from a past embedded kidney stone.    Past Surgical History  Procedure Laterality Date  . Renal calculi      cardiac stent x2  . Coronary stent placement      Medications: Prior to Admission medications   Medication Sig Start Date End Date Taking? Authorizing Provider  ALPRAZolam (XANAX) 0.25 MG tablet Take 1 tablet (0.25 mg total) by mouth daily as needed for sleep or anxiety. 02/21/13  Yes Minda Meo, MD  Artificial Tear Ointment (ARTIFICIAL TEARS) ointment Place 1 application into both eyes at bedtime.   Yes Historical Provider, MD  aspirin EC 81 MG EC tablet Take 1 tablet (81 mg total) by mouth daily. 02/21/13  Yes Minda Meo, MD  diltiazem [facility]) 300 MG 24 hr capsule Take 300 mg by mouth daily.   Yes Historical Provider, MD  docusate sodium (COLACE) 100 MG capsule Take 100 mg by mouth daily.    Yes Historical Provider, MD  escitalopram (LEXAPRO) 10 MG tablet Take 10 mg by mouth daily. 10/13/14  Yes Historical Provider, MD  HORSE CHESTNUT PO Take 1 capsule by mouth at bedtime. For leg pain   Yes  Historical Provider, MD  isosorbide mononitrate (IMDUR) 30 MG 24 hr tablet Take 30 mg by mouth daily.   Yes Historical Provider, MD  losartan (COZAAR) 50 MG tablet Take 50 mg by mouth daily. 09/22/14  Yes Historical Provider, MD  omeprazole (PRILOSEC) 40 MG capsule Take 40 mg by mouth daily.   Yes Historical Provider, MD  polyethylene glycol (MIRALAX / GLYCOLAX) packet Take 17 g by mouth daily as needed. 11/10/14  Yes Joseph Art, DO  prednisoLONE acetate (PRED FORTE) 1 % ophthalmic suspension Place 1 drop into both eyes daily.   Yes Historical Provider, MD  traMADol (ULTRAM) 50 MG tablet Take 50 mg by mouth at bedtime.   Yes Historical Provider, MD  triamterene-hydrochlorothiazide (MAXZIDE-25) 37.5-25 MG per tablet Take 1 tablet by mouth daily. 10/28/14  Yes Historical Provider, MD  calcitonin, salmon, (MIACALCIN/FORTICAL) 200 UNIT/ACT nasal spray Place 1 spray into the nose daily. Patient not taking: Reported on 12/26/2014 02/21/13   Minda Meo, MD  carisoprodol (SOMA) 350 MG tablet Take 1 tablet (350 mg total) by mouth 3 (three) times daily as needed (spasm). Patient not taking: Reported on 12/26/2014 02/21/13   Minda Meo, MD  sucralfate (CARAFATE) 1 GM/10ML suspension Take 10 mLs (1 g total) by mouth 4 (four) times daily -  with meals and at bedtime. Patient not taking: Reported on 12/26/2014 11/10/14   Joseph Art, DO    Allergies:   Allergies  Allergen Reactions  . Codeine Other (See Comments)    "makes her very sick"  . Novocain [Procaine] Other (See Comments)    Unknown reaction    Social History:  reports that she has never smoked. She has never used smokeless tobacco. She reports that she does not drink alcohol or use illicit drugs.  Family History: History reviewed. No pertinent family history.  Physical Exam: Filed Vitals:   12/26/14 1300 12/26/14 1315 12/26/14 1330 12/26/14 1345  BP: 111/48 125/52 117/51 116/52  Pulse: 77 82 75 74  Temp:      TempSrc:       Resp: SpO2: 98% 98% 99% 97%   General appearance: elderly WF sitting up, in no distress, non toxic Keeps eyes mostly closed, anicteric, no nystagmus Oral membranes moist Resp: no accessory ms in use, min left rhonchi, no rub Cardio: regular sl hyperdynamic with harsh murmur GI: soft, very tender bilat upper quadrants, reduceable hernia; bowel sounds reduced; no masses, or pulsations Extremities: extremities normal, atraumatic, no cyanosis or edema Pulses: sl reduced, trace edema, fungal nails   Neurologic: awake Alert and oriented X 3, sl HOH, mentating well .     Labs on Admission:   Recent Labs  12/26/14 0955 12/26/14 1021  NA 137 137  K 3.7 3.7  CL 104 103  CO2 23  --   GLUCOSE 137* 139*  BUN 27* 28*  CREATININE 1.18* 1.00  CALCIUM 9.5  --     Recent Labs  12/26/14 0955  AST 22  ALT 19  ALKPHOS 119*  BILITOT 1.1  PROT 6.9  ALBUMIN 3.7    Recent Labs  12/26/14 0955  LIPASE 19    Recent Labs  12/26/14 0955 12/26/14 1021  WBC 13.9*  --   NEUTROABS 12.8*  --   HGB 11.0* 12.6  HCT 33.4* 37.0  MCV 86.5  --   PLT 248  --         Radiological Exams on Admission: Ct Abdomen Pelvis W Contrast  12/26/2014   CLINICAL DATA:  81 year old female with diffuse abdominal pain nausea and constipation. Intermittent rectal bleeding. Initial encounter.  EXAM: CT ABDOMEN AND PELVIS WITH CONTRAST  TECHNIQUE: Multidetector CT imaging of the abdomen and pelvis was performed using the standard protocol following bolus administration of intravenous contrast.  CONTRAST:  1mL OMNIPAQUE IOHEXOL 300 MG/ML  SOLN  COMPARISON:  Chest CTA and CT Abdomen and Pelvis 11/08/2014.  FINDINGS: Stable cardiomegaly. New patchy opacity in the left lower lobe at the costophrenic angle, series 3, image 7. Mild dependent atelectasis on the right. No pericardial or pleural effusion.  Advanced degenerative changes in the spine. No acute osseous abnormality identified.  Negative  uterus and adnexa. No pelvic free fluid. Increased stool in the rectum. Redundant sigmoid colon with diverticulosis. No definite sigmoid active inflammation. Redundant left colon with retained stool. Oral contrast has reached the distal transverse colon. Negative hepatic flexure. Negative ascending colon.  At the cecum there is suggestion of wall thickening along the medial wall and at the ileocecal valve (series 2, image 46), but this area was completely normal on 11/08/2014. There is no surrounding mesenteric stranding or lymphadenopathy. Legrand Rams this is adherent stool.  No dilated small bowel. Decompressed stomach similar in appearance to that in December. Negative duodenum.  Stable liver  and gallbladder. No pericholecystic inflammation. Stable appearance of the pancreas, including a small cystic area in the pancreatic head. No abdominal free fluid. Negative spleen and adrenal glands. Stable kidneys, with symmetric renal contrast excretion.  Portal venous system is patent. Extensive Aortoiliac calcified atherosclerosis noted. Major arterial structures in the abdomen and pelvis are patent. No lymphadenopathy.  IMPRESSION: 1. No inflammatory process identified in the abdomen. Appearance of the ileocecal valve and medial cecum felt reflect adherent stool (was normal in December). Sigmoid diverticulosis without active inflammation identified. Increased retained stool from the prior study. 2. Abnormal patchy left lower lobe opacity new since December and highly suspicious for aspiration or pneumonia.   Electronically Signed   By: Odessa FlemingH  Hall M.D.   On: 12/26/2014 12:42   Dg Chest Port 1 View  12/26/2014   CLINICAL DATA:  81 year old female with extreme abdominal pain and shortness of breath since this morning. Initial encounter.  EXAM: PORTABLE CHEST - 1 VIEW  COMPARISON:  Chest CTA 11/08/2014 and earlier.  FINDINGS: Portable AP semi upright view at at 1015 hours. Increased pulmonary vascularity. Stable cardiac size and  mediastinal contours. No pneumothorax. No pleural effusion or consolidation. Chronic lingula atelectasis or scarring. No pneumoperitoneum identified. No bowel gas in the upper abdomen.  IMPRESSION: Increased pulmonary vascular congestion, consider interstitial edema.   Electronically Signed   By: Odessa FlemingH  Hall M.D.   On: 12/26/2014 10:27   Orders placed or performed during the hospital encounter of 12/26/14  . EKG 12-Lead: 26-Dec-2014 09:40:00 [facility]   Sinus rhythm Prolonged PR interval Left atrial enlargement Left axis deviation Borderline T abnormalities, anterior leads Confirmed by Fayrene FearingJAMES MD, MARK ([id number]) on 12/26/2014 9:56:28 AM  . EKG 12-Lead    Assessment/Plan Active Problems: Community acquired bacterial pneumonia: New patchy opacity in the left lower lobe at the costophrenic angle. Oxygenating OK. Doubt PE. On dual Abx with cepepiime and vanco.  ABDOMINAL PAIN: CT not impressive. No lactic acidosis or high WBC to suggest bowel ischemia. Lipase normal. No skin rash to suggest VZV. EKG non ischemic. Troponin is 0.01. LFT not elevated. Renal function is pretty good and no blockages noted. GB looks OK on CT. Pancreas looks OK MILDLY ELEVATED SUGARS" watch for now   CAD (coronary artery disease) : as above   Esophageal reflux: doing OK   Essential hypertension, benign: reasonable   Anxiety: on Rx     Anemia: trivial DNR status   Kaycee Mcgaugh ALAN 12/26/2014, 2:02 PM

## 2014-12-26 NOTE — ED Notes (Signed)
O2 sat dropped to 88%. 2L O2 applied via .

## 2014-12-26 NOTE — ED Notes (Signed)
Pt presents to department via [facility] from [facility] for evaluation of diffuse abdominal pain, nausea and constipation. Abdominal pain started on Monday afternoon. Reports constipation x1 week, also states intermittent rectal bleeding. 8/10 pain upon arrival to ED. Pt is alert and oriented x4.

## 2014-12-26 NOTE — ED Notes (Signed)
Patient transported to CT 

## 2014-12-26 NOTE — ED Notes (Signed)
Notified EDP of pt's O2 sats <90%

## 2014-12-26 NOTE — Progress Notes (Addendum)
ANTIBIOTIC CONSULT NOTE - INITIAL  Pharmacy Consult for Vancomycin Indication: PNA  Allergies  Allergen Reactions  . Codeine Other (See Comments)    "makes her very sick"  . Novocain [Procaine] Other (See Comments)    Unknown reaction    Patient Measurements: TBW as of 11/10/14 - 44.5 kg  Vital Signs: Temp: 98.6 F (37 C) (02/16 1227) Temp Source: Oral (02/16 1227) BP: 111/48 mmHg (02/16 1300) Pulse Rate: 77 (02/16 1300) Intake/Output from previous day:   Intake/Output from this shift:    Labs:  Recent Labs  12/26/14 0955 12/26/14 1021  WBC 13.9*  --   HGB 11.0* 12.6  PLT 248  --   CREATININE 1.18* 1.00   CrCl cannot be calculated (Unknown ideal weight.). No results for input(s): VANCOTROUGH, VANCOPEAK, VANCORANDOM, GENTTROUGH, GENTPEAK, GENTRANDOM, TOBRATROUGH, TOBRAPEAK, TOBRARND, AMIKACINPEAK, AMIKACINTROU, AMIKACIN in the last 72 hours.   Microbiology: No results found for this or any previous visit (from the past 720 hour(s)).  Medical History: Past Medical History  Diagnosis Date  . Hypertension   . Hx of heart artery stent   . Arthritis   . Hearing loss   . Vision loss, bilateral   . Kidney stone     Patient has distention on the right side from scar tissue from a past embedded kidney stone.     Medications:   (Not in a hospital admission)   Assessment: 81 yo F presents on 2/16 with abdominal pain and nausea. PMH includes HTN and  hx of heart stent. CT of abdomen showed patchy left lower lobe opacity new since December, highly suspicious for aspiration or pneumonia. SCr 1.00, CrCl ~4520ml/min. Afebrile, WBC elevated at 13.9. Cefepime 2g IV once was ordered in the ED.  Goal of Therapy:  Vancomycin trough level 15-20 mcg/ml  Resolution of infection  Plan:  Give vancomycin 1g IV x 1, then start 500mg  IV Q24 tomorrow Monitor renal function, clinical picture, VT at Css F/U plans to continue cefepime? (1g Q24 starting tomorrow?)  Enzo BiBATCHELDER,Cristen Murcia  J 12/26/2014,1:14 PM  Addendum: Will continue cefepime per pharmacy as well. Received cefepime 2g IV once in the ED.  Plan: Start cefepime 1g IV Q24 tomorrow

## 2014-12-27 ENCOUNTER — Inpatient Hospital Stay (HOSPITAL_COMMUNITY): Payer: Medicare Other

## 2014-12-27 DIAGNOSIS — K5900 Constipation, unspecified: Secondary | ICD-10-CM

## 2014-12-27 DIAGNOSIS — R1084 Generalized abdominal pain: Secondary | ICD-10-CM

## 2014-12-27 DIAGNOSIS — R140 Abdominal distension (gaseous): Secondary | ICD-10-CM

## 2014-12-27 LAB — COMPREHENSIVE METABOLIC PANEL
ALBUMIN: 2.7 g/dL — AB (ref 3.5–5.2)
ALT: 26 U/L (ref 0–35)
AST: 21 U/L (ref 0–37)
Alkaline Phosphatase: 83 U/L (ref 39–117)
Anion gap: 4 — ABNORMAL LOW (ref 5–15)
BUN: 24 mg/dL — ABNORMAL HIGH (ref 6–23)
CALCIUM: 8.7 mg/dL (ref 8.4–10.5)
CO2: 26 mmol/L (ref 19–32)
CREATININE: 1.11 mg/dL — AB (ref 0.50–1.10)
Chloride: 106 mmol/L (ref 96–112)
GFR calc Af Amer: 47 mL/min — ABNORMAL LOW (ref >90)
GFR calc non Af Amer: 40 mL/min — ABNORMAL LOW (ref >90)
Glucose, Bld: 101 mg/dL — ABNORMAL HIGH (ref 70–99)
POTASSIUM: 3.6 mmol/L (ref 3.5–5.1)
Sodium: 136 mmol/L (ref 135–145)
Total Bilirubin: 0.9 mg/dL (ref 0.3–1.2)
Total Protein: 5.4 g/dL — ABNORMAL LOW (ref 6.0–8.3)

## 2014-12-27 LAB — CBC
HCT: 27.9 % — ABNORMAL LOW (ref 36.0–46.0)
Hemoglobin: 9.1 g/dL — ABNORMAL LOW (ref 12.0–15.0)
MCH: 28.8 pg (ref 26.0–34.0)
MCHC: 32.6 g/dL (ref 30.0–36.0)
MCV: 88.3 fL (ref 78.0–100.0)
PLATELETS: 215 10*3/uL (ref 150–400)
RBC: 3.16 MIL/uL — ABNORMAL LOW (ref 3.87–5.11)
RDW: 14.7 % (ref 11.5–15.5)
WBC: 11.5 10*3/uL — ABNORMAL HIGH (ref 4.0–10.5)

## 2014-12-27 MED ORDER — POLYETHYLENE GLYCOL 3350 17 G PO PACK
17.0000 g | PACK | Freq: Two times a day (BID) | ORAL | Status: DC
  Administered 2014-12-28 – 2014-12-30 (×5): 17 g via ORAL
  Filled 2014-12-27 (×6): qty 1

## 2014-12-27 MED ORDER — TRAMADOL HCL 50 MG PO TABS
50.0000 mg | ORAL_TABLET | Freq: Four times a day (QID) | ORAL | Status: DC | PRN
  Administered 2014-12-28 – 2014-12-29 (×3): 50 mg via ORAL
  Filled 2014-12-27 (×4): qty 1

## 2014-12-27 MED ORDER — GLYCERIN (LAXATIVE) 2.1 G RE SUPP
1.0000 | Freq: Two times a day (BID) | RECTAL | Status: DC
Start: 2014-12-27 — End: 2014-12-30
  Administered 2014-12-27 – 2014-12-29 (×4): 1 via RECTAL
  Filled 2014-12-27 (×8): qty 1

## 2014-12-27 MED ORDER — SODIUM CHLORIDE 0.9 % IV SOLN
INTRAVENOUS | Status: DC
  Administered 2014-12-27 – 2014-12-29 (×2): via INTRAVENOUS

## 2014-12-27 MED ORDER — PANTOPRAZOLE SODIUM 40 MG PO TBEC
40.0000 mg | DELAYED_RELEASE_TABLET | Freq: Two times a day (BID) | ORAL | Status: DC
  Administered 2014-12-27 – 2014-12-30 (×6): 40 mg via ORAL
  Filled 2014-12-27 (×7): qty 1

## 2014-12-27 MED ORDER — HYDROMORPHONE HCL 1 MG/ML IJ SOLN
0.5000 mg | INTRAMUSCULAR | Status: DC | PRN
  Administered 2014-12-27 (×2): 0.5 mg via INTRAVENOUS
  Filled 2014-12-27: qty 1

## 2014-12-27 MED ORDER — POLYETHYLENE GLYCOL 3350 17 G PO PACK
17.0000 g | PACK | Freq: Every day | ORAL | Status: DC
  Administered 2014-12-27: 17 g via ORAL
  Filled 2014-12-27: qty 1

## 2014-12-27 MED ORDER — SORBITOL 70 % SOLN
960.0000 mL | TOPICAL_OIL | Freq: Once | ORAL | Status: AC
  Administered 2014-12-27: 960 mL via RECTAL
  Filled 2014-12-27: qty 240

## 2014-12-27 NOTE — Progress Notes (Signed)
Subjective: Daughter bedside, still upper abd pain--intense paroxysms. No n/v  Objective: Vital signs in last 24 hours: Temp:  [97.9 F (36.6 C)-100.9 F (38.3 C)] 97.9 F (36.6 C) (02/17 0610) Pulse Rate:  [72-84] 76 (02/17 0610) Resp:  [17-31] 17 (02/17 0610) BP: (108-147)/(47-94) 120/51 mmHg (02/17 0610) SpO2:  [88 %-99 %] 93 % (02/17 0610) Weight change:   CBG (last 3)  No results for input(s): GLUCAP in the last 72 hours.  Intake/Output from previous day:    Physical Exam: A/a calm-- periodic shake with pain, lying flat No jvd or bruits Chest cklear Abdomen soft, good bs--intense tenderness across upper abdomen but no rebound--disproportionate but consistent RRR 3/6 sem No edema No mottling Good pulses   Lab Results:  Recent Labs  12/26/14 0955 12/26/14 1021 12/27/14 0504  NA 137 137 136  K 3.7 3.7 3.6  CL 104 103 106  CO2 23  --  26  GLUCOSE 137* 139* 101*  BUN 27* 28* 24*  CREATININE 1.18* 1.00 1.11*  CALCIUM 9.5  --  8.7    Recent Labs  12/26/14 0955 12/27/14 0504  AST 22 21  ALT 19 26  ALKPHOS 119* 83  BILITOT 1.1 0.9  PROT 6.9 5.4*  ALBUMIN 3.7 2.7*    Recent Labs  12/26/14 0955 12/26/14 1021 12/27/14 0504  WBC 13.9*  --  11.5*  NEUTROABS 12.8*  --   --   HGB 11.0* 12.6 9.1*  HCT 33.4* 37.0 27.9*  MCV 86.5  --  88.3  PLT 248  --  215   Lab Results  Component Value Date   INR 1.12 11/09/2014   No results for input(s): CKTOTAL, CKMB, CKMBINDEX, TROPONINI in the last 72 hours. No results for input(s): TSH, T4TOTAL, T3FREE, THYROIDAB in the last 72 hours.  Invalid input(s): FREET3 No results for input(s): VITAMINB12, FOLATE, FERRITIN, TIBC, IRON, RETICCTPCT in the last 72 hours.  Studies/Results: Ct Abdomen Pelvis W Contrast  12/26/2014   CLINICAL DATA:  81 year old female with diffuse abdominal pain nausea and constipation. Intermittent rectal bleeding. Initial encounter.  EXAM: CT ABDOMEN AND PELVIS WITH CONTRAST  TECHNIQUE:  Multidetector CT imaging of the abdomen and pelvis was performed using the standard protocol following bolus administration of intravenous contrast.  CONTRAST:  1mL OMNIPAQUE IOHEXOL 300 MG/ML  SOLN  COMPARISON:  Chest CTA and CT Abdomen and Pelvis 11/08/2014.  FINDINGS: Stable cardiomegaly. New patchy opacity in the left lower lobe at the costophrenic angle, series 3, image 7. Mild dependent atelectasis on the right. No pericardial or pleural effusion.  Advanced degenerative changes in the spine. No acute osseous abnormality identified.  Negative uterus and adnexa. No pelvic free fluid. Increased stool in the rectum. Redundant sigmoid colon with diverticulosis. No definite sigmoid active inflammation. Redundant left colon with retained stool. Oral contrast has reached the distal transverse colon. Negative hepatic flexure. Negative ascending colon.  At the cecum there is suggestion of wall thickening along the medial wall and at the ileocecal valve (series 2, image 46), but this area was completely normal on 11/08/2014. There is no surrounding mesenteric stranding or lymphadenopathy. Legrand Rams this is adherent stool.  No dilated small bowel. Decompressed stomach similar in appearance to that in December. Negative duodenum.  Stable liver and gallbladder. No pericholecystic inflammation. Stable appearance of the pancreas, including a small cystic area in the pancreatic head. No abdominal free fluid. Negative spleen and adrenal glands. Stable kidneys, with symmetric renal contrast excretion.  Portal venous system is patent.  Extensive Aortoiliac calcified atherosclerosis noted. Major arterial structures in the abdomen and pelvis are patent. No lymphadenopathy.  IMPRESSION: 1. No inflammatory process identified in the abdomen. Appearance of the ileocecal valve and medial cecum felt reflect adherent stool (was normal in December). Sigmoid diverticulosis without active inflammation identified. Increased retained stool from the  prior study. 2. Abnormal patchy left lower lobe opacity new since December and highly suspicious for aspiration or pneumonia.   Electronically Signed   By: Odessa FlemingH  Hall M.D.   On: 12/26/2014 12:42   Dg Chest Port 1 View  12/26/2014   CLINICAL DATA:  81 year old female with extreme abdominal pain and shortness of breath since this morning. Initial encounter.  EXAM: PORTABLE CHEST - 1 VIEW  COMPARISON:  Chest CTA 11/08/2014 and earlier.  FINDINGS: Portable AP semi upright view at at 1015 hours. Increased pulmonary vascularity. Stable cardiac size and mediastinal contours. No pneumothorax. No pleural effusion or consolidation. Chronic lingula atelectasis or scarring. No pneumoperitoneum identified. No bowel gas in the upper abdomen.  IMPRESSION: Increased pulmonary vascular congestion, consider interstitial edema.   Electronically Signed   By: Odessa FlemingH  Hall M.D.   On: 12/26/2014 10:27     Assessment/Plan: 1, Abdominal pain- puzzling and clearly not pulmonary-- check US--r/oGB, Late angio--CC[facility] opinion, late zoster consideration 2. CAP-clinicALLY INCIDENTAL-eMPIRIC ABX BUT NOT ISSUE HERE, no evident on CXRAY 3. HTN 4. Depression 5.CAD-stable   LOS: 1 day   Melquiades Kovar A 12/27/2014, 7:54 AM

## 2014-12-27 NOTE — Consult Note (Signed)
Referring Provider: Dr. Jacky Kindle Primary Care Physician:  Minda Meo, MD Primary Gastroenterologist:    Reason for Consultation:   Abdominal pain    HPI: Victoria Juarez is a 81 y.o. female  who is followed medically by Dr. Jacky Kindle. She has a past medical history of hypertension, CAD with cardiac stenting 2, kidney stones, arthritis, hearing loss, vision loss, and recent admission for pneumonia. She lives alone in an independent apartment at [facility]. She contacted her daughter yesterday morning with complaints of abdominal pain and told her daughter she thought she had an intestinal blockage. The patient was transferred ported to [facility] emergency room by EMS with complaints of abdominal pain. Patient states she has had this type of pain intermittently for the past several months. She is troubled with constipation and uses Mira lax on a daily basis. Over the past 2 weeks she has had difficulty regulating her Mira lax and have skipped a week without a bowel movement and then had several loose stools. She has not noticed any bright red blood per rectum or melena but she really can't see well enough to ascertain the color. She describes the pain has crampy with sharp jabs. There is nothing that exacerbates it or alleviate it. There is no associated nausea or vomiting. She points to the suprapubic area and right side of the abdomen as to where the pain is most severe this morning. She had a temperature of 101 in the emergency room yesterday per the daughter but has been afebrile this morning.  Patient had an abdominal and pelvic CT in December that had no colonic abnormalities however there was a suspicion of a cystic lesion in the pancreatic head which was felt to be of doubtful clinical significance giving the patient age and was felt to most likely be a pseudocyst. Patient had a repeat abdominal and pelvic CT yesterday that showed no inflammatory process in the abdomen. The appearance of  the ileocecal valve and cecum was felt to reflect adherent stool which was not present in December. She was also noted to have sigmoid diverticulosis without active inflammation as well as increased retained stool from her prior study. There was a stable appearance of the pancreas including a small cystic area in the pancreatic head. Patient states her appetite has been good but she has been losing weight over the past several months. Patient had an abdominal ultrasound this morning that showed mild intrahepatic biliary ductal dilatation which was felt to likely be chronic based on previous CT. There was no evidence of a biliary obstruction on her metabolic panel blood work. The pancreas was not visualized on ultrasound.     Past Medical History  Diagnosis Date  . Hypertension   . Hearing loss     "both ears"  . Vision loss, bilateral   . High cholesterol   . Heart murmur   . Anginal pain   . Coronary artery disease   . GERD (gastroesophageal reflux disease)   . History of hiatal hernia   . Headache     "weekly" (12/26/2014)  . Arthritis     "all over" (12/26/2014)  . Chronic lower back pain   . Kidney stone     Patient has distention on the right side from scar tissue from a past embedded kidney stone.   . Trichiasis     "of lashes; gets this done q 3 months or so" (12/26/2014)  . HCAP (healthcare-associated pneumonia) 12/26/2014    Past Surgical History  Procedure Laterality Date  . Kidney stone surgery  1970's    "imbedded stone; had to cut her to get it out"  . Coronary angioplasty with stent placement  ~ 1998; ~ 2002    "1; 1"   . Dilation and curettage of uterus    . Entropian repair Right 01/01/2004    "lower eyelid"  . Ectropion repair Left 11/06/03    "lower eyelid"  . Entropian repair Bilateral     "lower"  . Punctoplasty Bilateral     "lower eyelids"  . Cataract extraction w/ intraocular lens implant Bilateral 2000's  . Eye surgery Bilateral 2000's    "to prevent  lashes growing inward; had upper and lower on one side; upper only on the other; has to have eyelashes plucked q 3 months or so" (12/26/2014(  . Eye surgery Bilateral     repair retractor    Prior to Admission medications   Medication Sig Start Date End Date Taking? Authorizing Provider  ALPRAZolam (XANAX) 0.25 MG tablet Take 1 tablet (0.25 mg total) by mouth daily as needed for sleep or anxiety. 02/21/13  Yes Minda Meo, MD  Artificial Tear Ointment (ARTIFICIAL TEARS) ointment Place 1 application into both eyes at bedtime.   Yes Historical Provider, MD  aspirin EC 81 MG EC tablet Take 1 tablet (81 mg total) by mouth daily. 02/21/13  Yes Minda Meo, MD  diltiazem [facility]) 300 MG 24 hr capsule Take 300 mg by mouth daily.   Yes Historical Provider, MD  docusate sodium (COLACE) 100 MG capsule Take 100 mg by mouth daily.    Yes Historical Provider, MD  escitalopram (LEXAPRO) 10 MG tablet Take 10 mg by mouth daily. 10/13/14  Yes Historical Provider, MD  HORSE CHESTNUT PO Take 1 capsule by mouth at bedtime. For leg pain   Yes Historical Provider, MD  isosorbide mononitrate (IMDUR) 30 MG 24 hr tablet Take 30 mg by mouth daily.   Yes Historical Provider, MD  losartan (COZAAR) 50 MG tablet Take 50 mg by mouth daily. 09/22/14  Yes Historical Provider, MD  omeprazole (PRILOSEC) 40 MG capsule Take 40 mg by mouth daily.   Yes Historical Provider, MD  polyethylene glycol (MIRALAX / GLYCOLAX) packet Take 17 g by mouth daily as needed. 11/10/14  Yes Joseph Art, DO  prednisoLONE acetate (PRED FORTE) 1 % ophthalmic suspension Place 1 drop into both eyes daily.   Yes Historical Provider, MD  traMADol (ULTRAM) 50 MG tablet Take 50 mg by mouth at bedtime.   Yes Historical Provider, MD  triamterene-hydrochlorothiazide (MAXZIDE-25) 37.5-25 MG per tablet Take 1 tablet by mouth daily. 10/28/14  Yes Historical Provider, MD  calcitonin, salmon, (MIACALCIN/FORTICAL) 200 UNIT/ACT nasal spray Place 1 spray into  the nose daily. Patient not taking: Reported on 12/26/2014 02/21/13   Minda Meo, MD  carisoprodol (SOMA) 350 MG tablet Take 1 tablet (350 mg total) by mouth 3 (three) times daily as needed (spasm). Patient not taking: Reported on 12/26/2014 02/21/13   Minda Meo, MD  sucralfate (CARAFATE) 1 GM/10ML suspension Take 10 mLs (1 g total) by mouth 4 (four) times daily -  with meals and at bedtime. Patient not taking: Reported on 12/26/2014 11/10/14   Joseph Art, DO    Current Facility-Administered Medications  Medication Dose Route Frequency Provider Last Rate Last Dose  . 0.9 %  sodium chloride infusion   Intravenous Continuous Minda Meo, MD      . albuterol (PROVENTIL) (2.5  MG/3ML) 0.083% nebulizer solution 2.5 mg  2.5 mg Nebulization Q4H PRN Julian Hy, MD      . ALPRAZolam Prudy Feeler) tablet 0.25 mg  0.25 mg Oral Daily PRN Julian Hy, MD      . artificial tears (LACRILUBE) ophthalmic ointment 1 application  1 application Both Eyes QHS Julian Hy, MD   1 application at 12/26/14 2200  . aspirin EC tablet 81 mg  81 mg Oral Daily Julian Hy, MD   81 mg at 12/26/14 1745  . calcitonin (salmon) (MIACALCIN/FORTICAL) nasal spray 1 spray  1 spray Alternating Nares Daily Julian Hy, MD      . ceFEPIme (MAXIPIME) 1 g in dextrose 5 % 50 mL IVPB  1 g Intravenous Q24H Rolland Porter, MD      . diltiazem [facility]) 24 hr capsule 300 mg  300 mg Oral Daily Julian Hy, MD   300 mg at 12/26/14 1744  . docusate sodium (COLACE) capsule 100 mg  100 mg Oral Daily Julian Hy, MD   100 mg at 12/26/14 1745  . enoxaparin (LOVENOX) injection 40 mg  40 mg Subcutaneous Q24H Julian Hy, MD   40 mg at 12/26/14 1745  . escitalopram (LEXAPRO) tablet 10 mg  10 mg Oral Daily Julian Hy, MD   10 mg at 12/26/14 1744  . HYDROmorphone (DILAUDID) injection 0.5 mg  0.5 mg Intravenous Q4H PRN Minda Meo, MD      . isosorbide mononitrate (IMDUR) 24 hr  tablet 30 mg  30 mg Oral Daily Julian Hy, MD   30 mg at 12/26/14 1744  . losartan (COZAAR) tablet 50 mg  50 mg Oral Daily Julian Hy, MD   50 mg at 12/26/14 1745  . pantoprazole (PROTONIX) EC tablet 40 mg  40 mg Oral BID Minda Meo, MD      . polyethylene glycol (MIRALAX / GLYCOLAX) packet 17 g  17 g Oral Daily Minda Meo, MD      . prednisoLONE acetate (PRED FORTE) 1 % ophthalmic suspension 1 drop  1 drop Both Eyes Daily Julian Hy, MD   1 drop at 12/26/14 1746  . traMADol (ULTRAM) tablet 50 mg  50 mg Oral Q6H PRN Minda Meo, MD      . vancomycin (VANCOCIN) 500 mg in sodium chloride 0.9 % 100 mL IVPB  500 mg Intravenous Q24H Para March Batchelder, RPH      . white petrolatum (VASELINE) gel   Topical PRN Rolland Porter, MD   0.2 application at 12/26/14 1045    Allergies as of 12/26/2014 - Review Complete 12/26/2014  Allergen Reaction Noted  . Codeine Other (See Comments) 02/19/2013  . Novocain [procaine] Other (See Comments) 02/19/2013    History reviewed. No pertinent family history.  History   Social History  . Marital Status: Widowed    Spouse Name: N/A  . Number of Children: N/A  . Years of Education: N/A   Occupational History  . Not on file.   Social History Main Topics  . Smoking status: Never Smoker   . Smokeless tobacco: Never Used  . Alcohol Use: No  . Drug Use: No  . Sexual Activity: No   Other Topics Concern  . Not on file   Social History Narrative    Review of Systems: Gen: Denies any fever, chills, sweats, anorexia, fatigue, weakness, malaise, weight loss, and sleep disorder CV: Denies chest pain, angina, palpitations, syncope, orthopnea,  PND, peripheral edema, and claudication. Resp: Denies dyspnea at rest, dyspnea with exercise, cough, sputum, wheezing, coughing up blood, and pleurisy. GI: Denies vomiting blood, jaundice, and fecal incontinence.   Denies dysphagia or odynophagia. GU : Denies urinary burning, blood  in urine, urinary frequency, urinary hesitancy, nocturnal urination, and urinary incontinence. MS: Denies joint pain, limitation of movement, and swelling, stiffness, low back pain, extremity pain. Denies muscle weakness, cramps, atrophy.  Derm: Denies rash, itching, dry skin, hives, moles, warts, or unhealing ulcers.  Psych: Denies depression, anxiety, memory loss, suicidal ideation, hallucinations, paranoia, and confusion. Heme: Denies bruising, bleeding, and enlarged lymph nodes. Neuro:  Denies any headaches, dizziness, paresthesias. Endo:  Denies any problems with DM, thyroid, adrenal function.  Physical Exam: Vital signs in last 24 hours: Temp:  [97.9 F (36.6 C)-98.6 F (37 C)] 97.9 F (36.6 C) (02/17 0610) Pulse Rate:  [72-84] 76 (02/17 0610) Resp:  [17-31] 17 (02/17 0610) BP: (108-147)/(47-94) 120/51 mmHg (02/17 0610) SpO2:  [88 %-99 %] 93 % (02/17 0610) Last BM Date: 12/26/15 General:   Alert,  Well-developed, well-nourished, pleasant and cooperative in NAD Head:  Normocephalic and atraumatic. Eyes:  Sclera clear, no icterus.   Conjunctiva pink. Ears:  Normal auditory acuity. Nose:  No deformity, discharge,  or lesions. Mouth:  No deformity or lesions.   Neck:  Supple; no masses or thyromegaly. Lungs:  Clear throughout to auscultation.   No wheezes, crackles, or rhonchi.  Heart:  Regular rate and rhythm; no murmurs, clicks, rubs,  or gallops. Abdomen:  Soft,suprapubic and right lower quadrant tenderness but no rebound or guarding,, BS active,nonpalp mass or hsm.   Rectal:  Brown stool, scant, tests heme negative.  Msk:  Symmetrical without gross deformities. . Pulses:  Normal pulses noted. Extremities:  Without clubbing or edema. Neurologic:  Alert and  oriented x4;  grossly normal neurologically. Skin:  Intact without significant lesions or rashes.. Psych:  Alert and cooperative. Normal mood and affect.      Lab Results:  Recent Labs  12/26/14 0955 12/26/14 1021  12/27/14 0504  WBC 13.9*  --  11.5*  HGB 11.0* 12.6 9.1*  HCT 33.4* 37.0 27.9*  PLT 248  --  215   BMET  Recent Labs  12/26/14 0955 12/26/14 1021 12/27/14 0504  NA 137 137 136  K 3.7 3.7 3.6  CL 104 103 106  CO2 23  --  26  GLUCOSE 137* 139* 101*  BUN 27* 28* 24*  CREATININE 1.18* 1.00 1.11*  CALCIUM 9.5  --  8.7   LFT  Recent Labs  12/27/14 0504  PROT 5.4*  ALBUMIN 2.7*  AST 21  ALT 26  ALKPHOS 83  BILITOT 0.9      Studies/Results: [place] Abdomen Complete  12/27/2014   CLINICAL DATA:  Abdominal discomfort  EXAM: ULTRASOUND ABDOMEN COMPLETE  COMPARISON:  Abdominal CT from yesterday  FINDINGS: Gallbladder: The gallbladder is distended but there is no wall thickening, focal tenderness, or stone to suggest obstruction and acute cholecystitis.  Common bile duct: Diameter: 5 mm  Liver: Mild intrahepatic biliary ductal dilatation, likely chronic based on previous CT. There is no evidence of biliary obstruction on complete metabolic panel. No evidence of focal mass lesion. A portovenous shunt in the left liver was not visualized.  IVC: No abnormality visualized.  Pancreas: Essentially not visualized.  Spleen: Size and appearance within normal limits.  Right Kidney: Length: 9 cm. Renal length loss and diffuse cortical thinning consistent with atrophy. No hydronephrosis or  mass lesion.  Left Kidney: Length: 9 cm. Renal length loss and diffuse cortical thinning consistent with atrophy. No hydronephrosis or solid mass lesion. There is a 2.7 cm simple cyst.  Abdominal aorta: The mid and distal aorta is not visualized due to bowel gas. No aneurysm noted on recent abdominal CT.  Other findings: Trace right pleural effusion.  IMPRESSION: 1. No explanation for acute abdominal pain. 2. Trace right pleural effusion. 3. The pancreas is not visible due to bowel gas.   Electronically Signed   By: Marnee Spring M.D.   On: 12/27/2014 09:18   Ct Abdomen Pelvis W Contrast  12/26/2014   CLINICAL DATA:   81 year old female with diffuse abdominal pain nausea and constipation. Intermittent rectal bleeding. Initial encounter.  EXAM: CT ABDOMEN AND PELVIS WITH CONTRAST  TECHNIQUE: Multidetector CT imaging of the abdomen and pelvis was performed using the standard protocol following bolus administration of intravenous contrast.  CONTRAST:  1mL OMNIPAQUE IOHEXOL 300 MG/ML  SOLN  COMPARISON:  Chest CTA and CT Abdomen and Pelvis 11/08/2014.  FINDINGS: Stable cardiomegaly. New patchy opacity in the left lower lobe at the costophrenic angle, series 3, image 7. Mild dependent atelectasis on the right. No pericardial or pleural effusion.  Advanced degenerative changes in the spine. No acute osseous abnormality identified.  Negative uterus and adnexa. No pelvic free fluid. Increased stool in the rectum. Redundant sigmoid colon with diverticulosis. No definite sigmoid active inflammation. Redundant left colon with retained stool. Oral contrast has reached the distal transverse colon. Negative hepatic flexure. Negative ascending colon.  At the cecum there is suggestion of wall thickening along the medial wall and at the ileocecal valve (series 2, image 46), but this area was completely normal on 11/08/2014. There is no surrounding mesenteric stranding or lymphadenopathy. Legrand Rams this is adherent stool.  No dilated small bowel. Decompressed stomach similar in appearance to that in December. Negative duodenum.  Stable liver and gallbladder. No pericholecystic inflammation. Stable appearance of the pancreas, including a small cystic area in the pancreatic head. No abdominal free fluid. Negative spleen and adrenal glands. Stable kidneys, with symmetric renal contrast excretion.  Portal venous system is patent. Extensive Aortoiliac calcified atherosclerosis noted. Major arterial structures in the abdomen and pelvis are patent. No lymphadenopathy.  IMPRESSION: 1. No inflammatory process identified in the abdomen. Appearance of the  ileocecal valve and medial cecum felt reflect adherent stool (was normal in December). Sigmoid diverticulosis without active inflammation identified. Increased retained stool from the prior study. 2. Abnormal patchy left lower lobe opacity new since December and highly suspicious for aspiration or pneumonia.   Electronically Signed   By: Odessa Fleming M.D.   On: 12/26/2014 12:42   Dg Chest Port 1 View  12/26/2014   CLINICAL DATA:  81 year old female with extreme abdominal pain and shortness of breath since this morning. Initial encounter.  EXAM: PORTABLE CHEST - 1 VIEW  COMPARISON:  Chest CTA 11/08/2014 and earlier.  FINDINGS: Portable AP semi upright view at at 1015 hours. Increased pulmonary vascularity. Stable cardiac size and mediastinal contours. No pneumothorax. No pleural effusion or consolidation. Chronic lingula atelectasis or scarring. No pneumoperitoneum identified. No bowel gas in the upper abdomen.  IMPRESSION: Increased pulmonary vascular congestion, consider interstitial edema.   Electronically Signed   By: Odessa Fleming M.D.   On: 12/26/2014 10:27    IMPRESSION/PLAN: Community acquired bacterial pneumonia: New patchy opacity in the left lower lobe at the costophrenic angle. Oxygenating OK. Doubt PE. On dual Abx with  cepepiime and vanco.   Abdominal pain--etiology not clear at this time. Pain seems out of proportion to exam, but  WBC elevation os lactic acidosis suggesting ischemia. Lipase and LFTs nl. GB appears nl, pancreas with small cyst--stable. Mild intrahepatic dilation-appears chronic. Pt has had increasing trouble with her constipation and over the past few weeks has been frustrated as stools alt between days of no BM and days of loose stools despite using miralax daily. May have colon spasm due to retained stool through colon noted on CT. Will increase miralax and add glycerin suppositories, if no result can add smog enema. Will review with Dr Leone Payor as well.    Hvozdovic, Moise Boring  12/27/2014,  Pager [phone]  [facility] Attending  I have also seen and assessed the patient and agree with the above note. I have reviewed images of CT. We think she is constipated and that is the cause of her problems. Admittedly a bit puzzling but laba dn CT data reassuring. Will try a SMOG enema and reassess.  Iva Boop, MD, [facility] [phone] (pager) 12/27/2014 4:23 PM

## 2014-12-27 NOTE — Clinical Social Work Psychosocial (Signed)
Clinical Social Work Department BRIEF PSYCHOSOCIAL ASSESSMENT 12/27/2014  Patient:  Victoria Juarez, Victoria Juarez     Account Number:  [id number]     Admit date:  12/26/2014  Clinical Social Worker:  Delrae Sawyers  Date/Time:  12/27/2014 03:58 PM  Referred by:  Physician  Date Referred:  12/27/2014 Referred for  ALF Placement   Other Referral:   none.   Interview type:  Patient Other interview type:   none.    PSYCHOSOCIAL DATA Living Status:  FACILITY Admitted from facility:  [facility] Level of care:  Independent Living Primary support name:  Nicanor Alcon and Delford Field Primary support relationship to patient:  CHILD, ADULT Degree of support available:   Patient reports patient's daughters are a strong support system.    CURRENT CONCERNS Current Concerns  Post-Acute Placement   Other Concerns:   none.    SOCIAL WORK ASSESSMENT / PLAN CSW received referral regarding patient admitted from [place]. CSW met with patient at bedside to discuss discharge disposition. Patient informed CSW patient has lived independently at [facility] for the past 13 years and considers [employer] as patient's home. Patient stated patient will be discharging back to [facility] once medically stable for discharge. Patient reported two daughters who are actively involved in patient's care.    CSW confirmed with Arlina Robes that patient is from [place]. CSW updated RNCM. No further CSW needs addressed; CSW signing off.   Assessment/plan status:  Psychosocial Support/Ongoing Assessment of Needs Other assessment/ plan:   none.   Information/referral to community resources:   Patient to return to [place] once medically stable for discharge.    PATIENT'S/FAMILY'S RESPONSE TO PLAN OF CARE: Patient understanding and agreeable to CSW plan of care. Patient expressed no further questions or concerns.       Lubertha Sayres, [place]  [phone]) Licensed Clinical Social Worker Orthopedics [phone]) and Surgical [phone])

## 2014-12-28 DIAGNOSIS — K5909 Other constipation: Secondary | ICD-10-CM

## 2014-12-28 LAB — URINE CULTURE: Colony Count: >=100000

## 2014-12-28 MED ORDER — LEVOFLOXACIN 250 MG PO TABS
250.0000 mg | ORAL_TABLET | Freq: Every day | ORAL | Status: DC
  Administered 2014-12-28 – 2014-12-30 (×3): 250 mg via ORAL
  Filled 2014-12-28 (×3): qty 1

## 2014-12-28 MED ORDER — SORBITOL 70 % SOLN
960.0000 mL | TOPICAL_OIL | Freq: Once | ORAL | Status: AC
  Administered 2014-12-28: 960 mL via RECTAL
  Filled 2014-12-28: qty 240

## 2014-12-28 MED ORDER — VANCOMYCIN HCL 500 MG IV SOLR
500.0000 mg | INTRAVENOUS | Status: DC
  Filled 2014-12-28: qty 500

## 2014-12-28 MED ORDER — VANCOMYCIN HCL 500 MG IV SOLR: 500.0000 mg | INTRAVENOUS | Status: DC

## 2014-12-28 NOTE — Progress Notes (Signed)
Patient very confused upon assessment this morning; ? from dilaudid pain medicine given yesterday. Patient alert and non-combative. IV removed from patient arm d/t swelling and infiltration.

## 2014-12-28 NOTE — Progress Notes (Addendum)
   Patient Name: Victoria Juarez Date of Encounter: 12/28/2014, 9:20 AM    Subjective  Some confusion overnight. Age + dilaudid probably Did have some results with SMOG enema I think she is having less pain "[name] like I would be better with a bowel movement but I don't think I can get it out"   Objective  BP 130/60 mmHg  Pulse 84  Temp(Src) 99.4 F (37.4 C) (Oral)  Resp 18  SpO2 90% Awake and alert Abdomen w/ + BS Soft, hyperesthetic in RUQ as before but overal benign and no peritoneal findings     Assessment and Plan  Abdominal pain and tenderness Constipation   I think she is a bit better Will give another SMOG enema and have her seen by my PA again this PM to gauge things   Iva Booparl E. Tranquilino Fischler, MD, [facility] [phone]) 12/28/2014 9:20 AM

## 2014-12-28 NOTE — Progress Notes (Signed)
Patient received second SMOG enema just this afternoon because patient was resting and daughter requested that it be given later when patient was awake so not to disturb her.  She just had a fairly large BM with a lot of liquid from enema and some larger pieces of stool as well per her nurse.  Patient's abdomen seems less distended to me, but still quite tender even to very light palpation on the right side of her abdomen.  She says that it has been painful on that side since she fell in the bathtub.  Reports that she's had a lot of falls recently.  Patient asking about discharge; told her that she will probably still be here until tomorrow.  Agree with above. ? Musculoskeletal pain in right abdomen given hx of falls. The hyperesthetic response to light palpation goes along with an abdominal wall pain.  Iva Booparl E. Gessner, MD, [facility] [phone] (pager) 12/28/2014 7:29 PM

## 2014-12-28 NOTE — Progress Notes (Signed)
Pt.fearful of saff, afraid someone is going to hurt her. Emotional support given and  daughter called for pt.reassurance. Agreed only to take meds when daughter comes in to visit

## 2014-12-28 NOTE — Progress Notes (Signed)
Attempted to give patient meds/enema. Daughter at bedside and requests RN to come back later since patient is resting. Will attempt again later.

## 2014-12-28 NOTE — Progress Notes (Signed)
Subjective: Awake, paranoia-dtr bedside  Objective: Vital signs in last 24 hours: Temp:  [98.2 F (36.8 C)-99.4 F (37.4 C)] 99.4 F (37.4 C) (02/18 0602) Pulse Rate:  [72-108] 84 (02/18 0602) Resp:  [18] 18 (02/18 0602) BP: (101-130)/(46-60) 130/60 mmHg (02/18 0602) SpO2:  [90 %-94 %] 90 % (02/18 0602) Weight:  [44.5 kg (98 lb 1.7 oz)] 44.5 kg (98 lb 1.7 oz) (02/18 0900) Weight change:   CBG (last 3)  No results for input(s): GLUCAP in the last 72 hours.  Intake/Output from previous day: 02/17 0701 - 02/18 0700 In: 360 [P.O.:360] Out: -   Physical Exam:  A/a calm-- periodic shake with pain, lying flat No jvd or bruits Chest cklear Abdomen soft, good bs--intense tenderness across upper abdomen but no rebound--disproportionate but consistent RRR 3/6 sem No edema No mottling Good pulses  Lab Results:  Recent Labs  12/26/14 0955 12/26/14 1021 12/27/14 0504  NA 137 137 136  K 3.7 3.7 3.6  CL 104 103 106  CO2 23  --  26  GLUCOSE 137* 139* 101*  BUN 27* 28* 24*  CREATININE 1.18* 1.00 1.11*  CALCIUM 9.5  --  8.7    Recent Labs  12/26/14 0955 12/27/14 0504  AST 22 21  ALT 19 26  ALKPHOS 119* 83  BILITOT 1.1 0.9  PROT 6.9 5.4*  ALBUMIN 3.7 2.7*    Recent Labs  12/26/14 0955 12/26/14 1021 12/27/14 0504  WBC 13.9*  --  11.5*  NEUTROABS 12.8*  --   --   HGB 11.0* 12.6 9.1*  HCT 33.4* 37.0 27.9*  MCV 86.5  --  88.3  PLT 248  --  215   Lab Results  Component Value Date   INR 1.12 11/09/2014   No results for input(s): CKTOTAL, CKMB, CKMBINDEX, TROPONINI in the last 72 hours. No results for input(s): TSH, T4TOTAL, T3FREE, THYROIDAB in the last 72 hours.  Invalid input(s): FREET3 No results for input(s): VITAMINB12, FOLATE, FERRITIN, TIBC, IRON, RETICCTPCT in the last 72 hours.  Studies/Results: [place] Abdomen Complete  12/27/2014   CLINICAL DATA:  Abdominal discomfort  EXAM: ULTRASOUND ABDOMEN COMPLETE  COMPARISON:  Abdominal CT from yesterday   FINDINGS: Gallbladder: The gallbladder is distended but there is no wall thickening, focal tenderness, or stone to suggest obstruction and acute cholecystitis.  Common bile duct: Diameter: 5 mm  Liver: Mild intrahepatic biliary ductal dilatation, likely chronic based on previous CT. There is no evidence of biliary obstruction on complete metabolic panel. No evidence of focal mass lesion. A portovenous shunt in the left liver was not visualized.  IVC: No abnormality visualized.  Pancreas: Essentially not visualized.  Spleen: Size and appearance within normal limits.  Right Kidney: Length: 9 cm. Renal length loss and diffuse cortical thinning consistent with atrophy. No hydronephrosis or mass lesion.  Left Kidney: Length: 9 cm. Renal length loss and diffuse cortical thinning consistent with atrophy. No hydronephrosis or solid mass lesion. There is a 2.7 cm simple cyst.  Abdominal aorta: The mid and distal aorta is not visualized due to bowel gas. No aneurysm noted on recent abdominal CT.  Other findings: Trace right pleural effusion.  IMPRESSION: 1. No explanation for acute abdominal pain. 2. Trace right pleural effusion. 3. The pancreas is not visible due to bowel gas.   Electronically Signed   By: Marnee SpringJonathon  Watts M.D.   On: 12/27/2014 09:18   Ct Abdomen Pelvis W Contrast  12/26/2014   CLINICAL DATA:  81 year old female with diffuse  abdominal pain nausea and constipation. Intermittent rectal bleeding. Initial encounter.  EXAM: CT ABDOMEN AND PELVIS WITH CONTRAST  TECHNIQUE: Multidetector CT imaging of the abdomen and pelvis was performed using the standard protocol following bolus administration of intravenous contrast.  CONTRAST:  1mL OMNIPAQUE IOHEXOL 300 MG/ML  SOLN  COMPARISON:  Chest CTA and CT Abdomen and Pelvis 11/08/2014.  FINDINGS: Stable cardiomegaly. New patchy opacity in the left lower lobe at the costophrenic angle, series 3, image 7. Mild dependent atelectasis on the right. No pericardial or pleural  effusion.  Advanced degenerative changes in the spine. No acute osseous abnormality identified.  Negative uterus and adnexa. No pelvic free fluid. Increased stool in the rectum. Redundant sigmoid colon with diverticulosis. No definite sigmoid active inflammation. Redundant left colon with retained stool. Oral contrast has reached the distal transverse colon. Negative hepatic flexure. Negative ascending colon.  At the cecum there is suggestion of wall thickening along the medial wall and at the ileocecal valve (series 2, image 46), but this area was completely normal on 11/08/2014. There is no surrounding mesenteric stranding or lymphadenopathy. Legrand Rams this is adherent stool.  No dilated small bowel. Decompressed stomach similar in appearance to that in December. Negative duodenum.  Stable liver and gallbladder. No pericholecystic inflammation. Stable appearance of the pancreas, including a small cystic area in the pancreatic head. No abdominal free fluid. Negative spleen and adrenal glands. Stable kidneys, with symmetric renal contrast excretion.  Portal venous system is patent. Extensive Aortoiliac calcified atherosclerosis noted. Major arterial structures in the abdomen and pelvis are patent. No lymphadenopathy.  IMPRESSION: 1. No inflammatory process identified in the abdomen. Appearance of the ileocecal valve and medial cecum felt reflect adherent stool (was normal in December). Sigmoid diverticulosis without active inflammation identified. Increased retained stool from the prior study. 2. Abnormal patchy left lower lobe opacity new since December and highly suspicious for aspiration or pneumonia.   Electronically Signed   By: Odessa Fleming M.D.   On: 12/26/2014 12:42     Assessment/Plan:  1, Abdominal pain- per gi--enemas and dc asap 2. CAP-clinicALLY INCIDENTAL-eMPIRIC ABX BUT NOT ISSUE HERE, no evident on CXRAY 3. HTN 4. Depression 5.CAD-stable   LOS: 2 days   Todrick Siedschlag A 12/28/2014, 10:45  AM

## 2014-12-29 DIAGNOSIS — R1011 Right upper quadrant pain: Secondary | ICD-10-CM

## 2014-12-29 MED ORDER — LIDOCAINE 5 % EX PTCH
1.0000 | MEDICATED_PATCH | CUTANEOUS | Status: DC
  Administered 2014-12-29: 1 via TRANSDERMAL
  Filled 2014-12-29: qty 1

## 2014-12-29 NOTE — Evaluation (Signed)
Physical Therapy Evaluation Patient Details Name: Victoria Juarez MRN: [id number] DOB: Feb 16, 1915 Today's Date: 12/29/2014   History of Present Illness  81YO with CAD, recent CP admission. Eval in ER suggests a low LLL pneumonia that perhaps could be causing referred pain. pt reports multiple falls at independent living facility.   Clinical Impression  Pt adm due to above. Pt presents with decreased independence with functional mobility. Pt is a high fall risk. Pt to benefit from skilled acute PT to address deficits indicated below (see PT problem list). At length discussion regarding D/C planning. Daughter is checking in on pt having 24/7 caregiver (A) at independent living facility. If 24/7 (A) cannot be provided, recommend SNF for post acute rehab.     Follow Up Recommendations Supervision/Assistance - 24 hour;SNF;Other (comment) (if caregiver can be 24/7; can D/C to independent living )    Equipment Recommendations  None recommended by PT    Recommendations for Other Services OT consult     Precautions / Restrictions Precautions Precautions: Fall Precaution Comments: pt reports multiple falls at independent living facility Restrictions Weight Bearing Restrictions: No      Mobility  Bed Mobility               General bed mobility comments: up in chair  Transfers Overall transfer level: Needs assistance Equipment used: Rolling walker (2 wheeled) Transfers: Sit to/from Stand Sit to Stand: Mod assist         General transfer comment: pt very kyphotic; required mod (A) to elevate trunk and stand due to general LE weakness; cues for safety and sequencing   Ambulation/Gait Ambulation/Gait assistance: Min assist Ambulation Distance (Feet): 16 Feet Assistive device: Rolling walker (2 wheeled) Gait Pattern/deviations: Decreased stride length;Step-through pattern;Shuffle;Trunk flexed;Narrow base of support Gait velocity: decreased Gait velocity interpretation: Below  normal speed for age/gender General Gait Details: pt with difficulty managing RW; (A) to manage RW and maintain balance; pt fatigued very quickly and required return to seat; max cues for safety  Stairs            Wheelchair Mobility    Modified Rankin (Stroke Patients Only)       Balance Overall balance assessment: Needs assistance Sitting-balance support: Feet supported;No upper extremity supported Sitting balance-Leahy Scale: Fair     Standing balance support: During functional activity;Bilateral upper extremity supported Standing balance-Leahy Scale: Poor Standing balance comment: RW and (A) to balance                              Pertinent Vitals/Pain Pain Assessment: Faces Faces Pain Scale: Hurts little more Pain Location: c/o back pain Pain Descriptors / Indicators: Constant;Aching Pain Intervention(s): Monitored during session;Premedicated before session;Repositioned    Home Living Family/patient expects to be discharged to:: Other (Comment)                 Additional Comments: Discussed D/C planning at length with family and pt; hopeful for cargiver to provide 24/7 (A) and return to [facility]    Prior Function Level of Independence: Independent with assistive device(s)         Comments: pt ambulating with rollator to/from dining area      Hand Dominance        Extremity/Trunk Assessment   Upper Extremity Assessment: Defer to OT evaluation           Lower Extremity Assessment: Generalized weakness      Cervical / Trunk Assessment:  Kyphotic  Communication   Communication: HOH  Cognition Arousal/Alertness: Awake/alert Behavior During Therapy: WFL for tasks assessed/performed Overall Cognitive Status: Within Functional Limits for tasks assessed                      General Comments General comments (skin integrity, edema, etc.): discussed D/C disposition with pt and family     Exercises         Assessment/Plan    PT Assessment Patient needs continued PT services  PT Diagnosis Difficulty walking;Generalized weakness;Acute pain   PT Problem List Decreased strength;Decreased activity tolerance;Decreased balance;Decreased mobility;Decreased cognition;Decreased knowledge of use of DME;Decreased safety awareness;Pain  PT Treatment Interventions Gait training;DME instruction;Functional mobility training;Therapeutic activities;Therapeutic exercise;Balance training;Neuromuscular re-education;Patient/family education   PT Goals (Current goals can be found in the Care Plan section) Acute Rehab PT Goals Patient Stated Goal: family and pt goals is to get back to where she was PTA PT Goal Formulation: With patient/family Time For Goal Achievement: 01/05/15 Potential to Achieve Goals: Fair    Frequency Min 3X/week   Barriers to discharge Decreased caregiver support from independent living    Co-evaluation               End of Session Equipment Utilized During Treatment: Gait belt Activity Tolerance: Patient limited by fatigue Patient left: in chair;with call bell/phone within reach;with family/visitor present Nurse Communication: Mobility status         Time: 1310-1326 PT Time Calculation (min) (ACUTE ONLY): 16 min   Charges:   PT Evaluation $Initial PT Evaluation Tier I: 1 Procedure     PT G CodesDonell Sievert:        Seren Chaloux N, [place]PT  [phone] 12/29/2014, 1:49 PM

## 2014-12-29 NOTE — Care Management Note (Signed)
  Page 1 of 1   12/29/2014     10:30:58 AM CARE MANAGEMENT NOTE 12/29/2014  Patient:  Victoria Juarez,Victoria Juarez   Account Number:  [id number]  Date Initiated:  12/29/2014  Documentation initiated by:  Ronny FlurryWILE,Kristin Lamagna  Subjective/Objective Assessment:     Action/Plan:   Anticipated DC Date:     Anticipated DC Plan:           Choice offered to / List presented to:             Status of service:   Medicare Important Message given?  YES (If response is "NO", the following Medicare IM given date fields will be blank) Date Medicare IM given:  12/29/2014 Medicare IM given by:  Ronny FlurryWILE,Arla Boutwell Date Additional Medicare IM given:   Additional Medicare IM given by:    Discharge Disposition:    Per UR Regulation:    If discussed at Long Length of Stay Meetings, dates discussed:    Comments:  12-29-14 Awaiting PT/OT evals . Ronny FlurryHeather Kanisha Duba RN BSN

## 2014-12-29 NOTE — Progress Notes (Signed)
Subjective: Little change.  Abdominal sxs wax and wane--disproportionate.  Little change with bowel purge. No n/v--claims to feel bad but not an uncommon complaint for her  Objective: Vital signs in last 24 hours: Temp:  [97.9 F (36.6 C)-99.1 F (37.3 C)] 97.9 F (36.6 C) (02/19 0527) Pulse Rate:  [63-84] 63 (02/19 0527) Resp:  [16-18] 18 (02/19 0527) BP: (118-120)/(55-62) 118/56 mmHg (02/19 0527) SpO2:  [97 %-98 %] 98 % (02/19 0527) Weight change:   CBG (last 3)  No results for input(s): GLUCAP in the last 72 hours.  Intake/Output from previous day: 02/18 0701 - 02/19 0700 In: 1371.7 [P.O.:640; I.V.:731.7] Out: 250 [Urine:250]  Physical Exam: A/a--lying comfortable No jvd Chest clear  RRR- 3/6 sem Abdomen soft, good bs--diffuse upper tenderness-disproportionate No edema Neuro normal   Lab Results:  Recent Labs  12/27/14 0504  NA 136  K 3.6  CL 106  CO2 26  GLUCOSE 101*  BUN 24*  CREATININE 1.11*  CALCIUM 8.7    Recent Labs  12/27/14 0504  AST 21  ALT 26  ALKPHOS 83  BILITOT 0.9  PROT 5.4*  ALBUMIN 2.7*    Recent Labs  12/27/14 0504  WBC 11.5*  HGB 9.1*  HCT 27.9*  MCV 88.3  PLT 215   Lab Results  Component Value Date   INR 1.12 11/09/2014   No results for input(s): CKTOTAL, CKMB, CKMBINDEX, TROPONINI in the last 72 hours. No results for input(s): TSH, T4TOTAL, T3FREE, THYROIDAB in the last 72 hours.  Invalid input(s): FREET3 No results for input(s): VITAMINB12, FOLATE, FERRITIN, TIBC, IRON, RETICCTPCT in the last 72 hours.  Studies/Results: No results found.   Assessment/Plan: 1. Abdominal sxs- no pathology, appreciate GI help, unlcear if m/f, early zoster regardless nothing fixable 2. CAP- by CT, empiric antibiotic but no clinical sxs 3. CAD/AS- stable, well compensated 4. Chronic depression  Lenthy discussion with daughter.  SNF declined--at this point palliative approach, sx management focusing on good BMs--will advance  diet, wean fluids and dc to [facility] in am.  Again HH and snf declined-claims pays for additional support at alf   LOS: 3 days   Osamah Schmader A 12/29/2014, 1:24 PM

## 2014-12-29 NOTE — Progress Notes (Signed)
     [facility] Progress Note  Subjective:  Daughters in the room; very anxious about possible discharge tomorrow because they do not think that she is well enough to leave.  PT/OT to see the patient today.  Her diet was advanced this morning and she was eating breakfast while I was in the room; pain did not seem to worsen with food.  Patient says that the pain is somewhat better, but feels a little more bloated again today.  Had a few more BM's after the SMOG enema yesterday afternoon.  Objective:  Vital signs in last 24 hours: Temp:  [97.9 F (36.6 C)-99.1 F (37.3 C)] 97.9 F (36.6 C) (02/19 0527) Pulse Rate:  [63-84] 63 (02/19 0527) Resp:  [16-18] 18 (02/19 0527) BP: (118-120)/(55-62) 118/56 mmHg (02/19 0527) SpO2:  [97 %-98 %] 98 % (02/19 0527) Last BM Date: 12/28/14 General:  Alert, elderly and frail, in NAD Heart:  Regular rate and rhythm Pulm:  CTAB.  No W/R/R. Abdomen:  Soft, but slightly distended.  Has good bowel sounds.  Diffuse TTP, but > on right side of abdomen (winces with pain when I barely lay my hand on her abdomen). Extremities:  Without edema. Neurologic:  Alert and  oriented x4; grossly normal neurologically.  Intake/Output from previous day: 02/18 0701 - 02/19 0700 In: 1371.7 [P.O.:640; I.V.:731.7] Out: 250 [Urine:250]  Lab Results:  Recent Labs  12/26/14 0955 12/26/14 1021 12/27/14 0504  WBC 13.9*  --  11.5*  HGB 11.0* 12.6 9.1*  HCT 33.4* 37.0 27.9*  PLT 248  --  215   BMET  Recent Labs  12/26/14 0955 12/26/14 1021 12/27/14 0504  NA 137 137 136  K 3.7 3.7 3.6  CL 104 103 106  CO2 23  --  26  GLUCOSE 137* 139* 101*  BUN 27* 28* 24*  CREATININE 1.18* 1.00 1.11*  CALCIUM 9.5  --  8.7   LFT  Recent Labs  12/27/14 0504  PROT 5.4*  ALBUMIN 2.7*  AST 21  ALT 26  ALKPHOS 83  BILITOT 0.9   Assessment / Plan: *Abdominal pain and tenderness:  ? Musculoskeletal given history of falls, which seems to be when pain began  according to the patient.  Also ? shingles as her pain is very superficial. *Constipation:  Needs a good bowel regimen at home; would keep her on at least daily, if not BID, Miralax.   LOS: 3 days   ZEHR, JESSICA D.  12/29/2014, 9:19 AM  Pager number [phone]   [facility] Attending  I have also seen and assessed the patient and agree with the above note. Will try a lidoderm patch to RUQ to see if that helps. No other ideas. Please call if ?  Iva Booparl E. Lucee Brissett, MD, Antionette FairyFACG [facility] [phone] (pager) 12/29/2014 6:08 PM

## 2014-12-30 MED ORDER — LEVOFLOXACIN 250 MG PO TABS: 250.0000 mg | ORAL_TABLET | Freq: Every day | ORAL | Status: DC

## 2014-12-30 NOTE — Clinical Social Work Note (Signed)
CSW contacted by [facility], who states patient's daughter is now requesting SNF. CSW met with patient's daughter who states sister is experiencing difficulty with contacting a representative with the independent agency who provides assistance to residents of her retirement community. Daughter states neither she or her sister can provide the supervision patient needs and without a provider from the agency, they are concerned for patient's safety. Daughter states they want patient to "rehabilitate" before she can return to prior independent living. Patient's daughter states family is agreeable to [facility] SNF referral. CSW acknowledged daughter's concern and made daughter aware CSW will follow-up with MD with concerns. CSW contacted MD Reynaldo Minium) who is agreeable with SNF placement. CSW sent SNF referral and spoke with Abigail Butts at [facility]. Per Abigail Butts, facility can provide patient with a bed. CSW to follow with daughter's.   [place], [place] Weekend Clinical Social Worker [phone]

## 2014-12-30 NOTE — Clinical Social Work Placement (Signed)
Clinical Social Work Department CLINICAL SOCIAL WORK PLACEMENT NOTE 12/30/2014  Patient:  Victoria Juarez,Victoria P  Account Number:  [id number] Admit date:  12/26/2014  Clinical Social Worker:  Vivi Barrackrystal Patrick-jefferson, LCSWA  Date/time:  12/30/2014 05:00 PM  Clinical Social Work is seeking post-discharge placement for this patient at the following level of care:   SKILLED NURSING   (*CSW will update this form in Epic as items are completed)   12/30/2014  [facility]family provided with [facility] of Clinical Social Work's list of facilities offering this level of care within the geographic area requested by the patient (or if unable, by the patient's family).  12/30/2014  Patient/family informed of their freedom to choose among providers that offer the needed level of care, that participate in Medicare, Medicaid or managed care program needed by the patient, have an available bed and are willing to accept the patient.  12/30/2014  Patient/family informed of [facility]' ownership interest in [facility], as well as of the fact that they are under no obligation to receive care at this facility.  PASARR submitted to EDS on Existing PASARR number received on Existing  FL2 transmitted to all facilities in geographic area requested by pt/family on  12/30/2014 FL2 transmitted to all facilities within larger geographic area on   Patient informed that his/her managed care company has contracts with or will negotiate with  certain facilities, including the following:     Patient/family informed of bed offers received:  12/30/2014 [facility] Physician recommends and patient chooses bed at    [facility]  12/30/2014 Patient to be transferred to facility by EMS Patient and family notified of transfer on 12/30/2014 Name of family member notified:  Britta MccreedyBarbara Black/ Charlynne CousinsKathy Madren  The  following physician request were entered in Epic:   Additional Comments:  Vivi Barrackrystal Patrick-Jefferson, LCSWA Weekend Clinical Social Worker [phone]

## 2014-12-30 NOTE — Discharge Summary (Signed)
DISCHARGE SUMMARY  Victoria Juarez  MR#: [id number]  DOB:29-Nov-1914  Date of Admission: 12/26/2014 Date of Discharge: 12/30/2014  Attending Physician:Earnstine Meinders A  Patient's BJY:NWGNFAO,ZHYQMVHPCP:Telesia Ates A, MD  Consults:  gi  Discharge Diagnoses: Active Problems:   CAD (coronary artery disease)   Esophageal reflux   Essential hypertension, benign   Anxiety   HCAP (healthcare-associated pneumonia)   Community acquired bacterial pneumonia   Anemia   Abdominal pain-constipation  Discharge Medications:   Medication List    TAKE these medications        ALPRAZolam 0.25 MG tablet  Commonly known as:  XANAX  Take 1 tablet (0.25 mg total) by mouth daily as needed for sleep or anxiety.     artificial tears ointment  Place 1 application into both eyes at bedtime.     aspirin 81 MG EC tablet  Take 1 tablet (81 mg total) by mouth daily.     calcitonin (salmon) 200 UNIT/ACT nasal spray  Commonly known as:  MIACALCIN/FORTICAL  Place 1 spray into the nose daily.     carisoprodol 350 MG tablet  Commonly known as:  SOMA  Take 1 tablet (350 mg total) by mouth 3 (three) times daily as needed (spasm).     diltiazem 300 MG 24 hr capsule  Commonly known as:  TIAZAC  Take 300 mg by mouth daily.     docusate sodium 100 MG capsule  Commonly known as:  COLACE  Take 100 mg by mouth daily.     escitalopram 10 MG tablet  Commonly known as:  LEXAPRO  Take 10 mg by mouth daily.     HORSE CHESTNUT PO  Take 1 capsule by mouth at bedtime. For leg pain     isosorbide mononitrate 30 MG 24 hr tablet  Commonly known as:  IMDUR  Take 30 mg by mouth daily.     levofloxacin 250 MG tablet  Commonly known as:  LEVAQUIN  Take 1 tablet (250 mg total) by mouth daily.     losartan 50 MG tablet  Commonly known as:  COZAAR  Take 50 mg by mouth daily.     omeprazole 40 MG capsule  Commonly known as:  PRILOSEC  Take 40 mg by mouth daily.     polyethylene glycol packet  Commonly known as:   MIRALAX / GLYCOLAX  Take 17 g by mouth daily as needed.     prednisoLONE acetate 1 % ophthalmic suspension  Commonly known as:  PRED FORTE  Place 1 drop into both eyes daily.     sucralfate 1 GM/10ML suspension  Commonly known as:  CARAFATE  Take 10 mLs (1 g total) by mouth 4 (four) times daily -  with meals and at bedtime.     traMADol 50 MG tablet  Commonly known as:  ULTRAM  Take 50 mg by mouth at bedtime.     triamterene-hydrochlorothiazide 37.5-25 MG per tablet  Commonly known as:  MAXZIDE-25  Take 1 tablet by mouth daily.        Hospital Procedures: [place] Abdomen Complete  12/27/2014   CLINICAL DATA:  Abdominal discomfort  EXAM: ULTRASOUND ABDOMEN COMPLETE  COMPARISON:  Abdominal CT from yesterday  FINDINGS: Gallbladder: The gallbladder is distended but there is no wall thickening, focal tenderness, or stone to suggest obstruction and acute cholecystitis.  Common bile duct: Diameter: 5 mm  Liver: Mild intrahepatic biliary ductal dilatation, likely chronic based on previous CT. There is no evidence of biliary obstruction on complete metabolic panel. No evidence  of focal mass lesion. A portovenous shunt in the left liver was not visualized.  IVC: No abnormality visualized.  Pancreas: Essentially not visualized.  Spleen: Size and appearance within normal limits.  Right Kidney: Length: 9 cm. Renal length loss and diffuse cortical thinning consistent with atrophy. No hydronephrosis or mass lesion.  Left Kidney: Length: 9 cm. Renal length loss and diffuse cortical thinning consistent with atrophy. No hydronephrosis or solid mass lesion. There is a 2.7 cm simple cyst.  Abdominal aorta: The mid and distal aorta is not visualized due to bowel gas. No aneurysm noted on recent abdominal CT.  Other findings: Trace right pleural effusion.  IMPRESSION: 1. No explanation for acute abdominal pain. 2. Trace right pleural effusion. 3. The pancreas is not visible due to bowel gas.   Electronically Signed    By: Marnee Spring M.D.   On: 12/27/2014 09:18   Ct Abdomen Pelvis W Contrast  12/26/2014   CLINICAL DATA:  81 year old female with diffuse abdominal pain nausea and constipation. Intermittent rectal bleeding. Initial encounter.  EXAM: CT ABDOMEN AND PELVIS WITH CONTRAST  TECHNIQUE: Multidetector CT imaging of the abdomen and pelvis was performed using the standard protocol following bolus administration of intravenous contrast.  CONTRAST:  1mL OMNIPAQUE IOHEXOL 300 MG/ML  SOLN  COMPARISON:  Chest CTA and CT Abdomen and Pelvis 11/08/2014.  FINDINGS: Stable cardiomegaly. New patchy opacity in the left lower lobe at the costophrenic angle, series 3, image 7. Mild dependent atelectasis on the right. No pericardial or pleural effusion.  Advanced degenerative changes in the spine. No acute osseous abnormality identified.  Negative uterus and adnexa. No pelvic free fluid. Increased stool in the rectum. Redundant sigmoid colon with diverticulosis. No definite sigmoid active inflammation. Redundant left colon with retained stool. Oral contrast has reached the distal transverse colon. Negative hepatic flexure. Negative ascending colon.  At the cecum there is suggestion of wall thickening along the medial wall and at the ileocecal valve (series 2, image 46), but this area was completely normal on 11/08/2014. There is no surrounding mesenteric stranding or lymphadenopathy. Legrand Rams this is adherent stool.  No dilated small bowel. Decompressed stomach similar in appearance to that in December. Negative duodenum.  Stable liver and gallbladder. No pericholecystic inflammation. Stable appearance of the pancreas, including a small cystic area in the pancreatic head. No abdominal free fluid. Negative spleen and adrenal glands. Stable kidneys, with symmetric renal contrast excretion.  Portal venous system is patent. Extensive Aortoiliac calcified atherosclerosis noted. Major arterial structures in the abdomen and pelvis are patent.  No lymphadenopathy.  IMPRESSION: 1. No inflammatory process identified in the abdomen. Appearance of the ileocecal valve and medial cecum felt reflect adherent stool (was normal in December). Sigmoid diverticulosis without active inflammation identified. Increased retained stool from the prior study. 2. Abnormal patchy left lower lobe opacity new since December and highly suspicious for aspiration or pneumonia.   Electronically Signed   By: Odessa Fleming M.D.   On: 12/26/2014 12:42   Dg Chest Port 1 View  12/26/2014   CLINICAL DATA:  81 year old female with extreme abdominal pain and shortness of breath since this morning. Initial encounter.  EXAM: PORTABLE CHEST - 1 VIEW  COMPARISON:  Chest CTA 11/08/2014 and earlier.  FINDINGS: Portable AP semi upright view at at 1015 hours. Increased pulmonary vascularity. Stable cardiac size and mediastinal contours. No pneumothorax. No pleural effusion or consolidation. Chronic lingula atelectasis or scarring. No pneumoperitoneum identified. No bowel gas in the upper abdomen.  IMPRESSION:  Increased pulmonary vascular congestion, consider interstitial edema.   Electronically Signed   By: Odessa Fleming M.D.   On: 12/26/2014 10:27    History of Present Illness:  81YO with CAD, recent CP admission. Called her dtr this AM to report "a blockage". Had a very tender and painful abdomen. Able to eat normally last night. No N/V. No worsening of pain with food. Bowels a bit slow but working. No f/c/s. Eval in ER suggests a low LLL pneumonia that perhaps could be causing referred pain. Some SOB. No CP. No skin rash/prior had Zostavax. Also noted to have a UTI Now doesn't feel hungry. Pain is 70% better but still can't bear to have anyone touch her upper quadrants bilaterally. Hospital Course: Admitted and full radiographic work-up for underlying pathology was negative.  Labs without evidence of ischemia or infection.  We did treat and will complete a course of antibiotic for radiographic  changes on ct not cxray for pneumonia--no clinical sxs.  GI consulted and ordered several enemas.  I had an extensive discussion with dtr--no path, realistic approach is expectant management.  They declined snf or HH assuring me that they had adeequate help at alf. At all times pain seemed disproportionate to pathologic and clinical findings--still not certain all constipation.  Attempted [facility] consult for opinion in addition to GI but they declined to see unless obvious surgical problem.2  Day of Discharge Exam BP 168/73 mmHg  Pulse 80  Temp(Src) 99.3 F (37.4 C) (Oral)  Resp 17  Ht  (1.499 m)  Wt 44.5 kg (98 lb 1.7 oz)  BMI 19.80 kg/m2  SpO2 90%  Physical Exam: General appearance: alert, cooperative and no distress Eyes: no scleral icterus Throat: oropharynx moist without erythema Resp: clear to auscultation bilaterally Cardio: regular rate and rhythm, S1, S2 normal and systolic murmur: holosystolic 3/6, crescendo throughout the precordium Extremities: no clubbing, cyanosis or edema Abdomen soft=good bs--jumpy with  Palpation-no chg  Discharge Labs: No results for input(s): NA, K, CL, CO2, GLUCOSE, BUN, CREATININE, CALCIUM, MG, PHOS in the last 72 hours. No results for input(s): AST, ALT, ALKPHOS, BILITOT, PROT, ALBUMIN in the last 72 hours. No results for input(s): WBC, NEUTROABS, HGB, HCT, MCV, PLT in the last 72 hours. No results for input(s): CKTOTAL, CKMB, CKMBINDEX, TROPONINI in the last 72 hours. No results for input(s): TSH, T4TOTAL, T3FREE, THYROIDAB in the last 72 hours.  Invalid input(s): FREET3 No results for input(s): VITAMINB12, FOLATE, FERRITIN, TIBC, IRON, RETICCTPCT in the last 72 hours.  Discharge instructions:     Discharge Instructions    Diet - low sodium heart healthy    Complete by:  As directed      Increase activity slowly    Complete by:  As directed            Disposition: alf  Follow-up Appts: Follow-up with Dr. Jacky Kindle at [facility] in 1week.  Call for appointment.  Condition on Discharge: improvwed  Tests Needing Follow-up: none  Signed: Javeon Macmurray A 12/30/2014, 10:36 AM

## 2014-12-30 NOTE — Progress Notes (Signed)
[facility] ).  Report given to staff at the faclity.

## 2015-01-01 LAB — CULTURE, BLOOD (ROUTINE X 2)
CULTURE: NO GROWTH
CULTURE: NO GROWTH

## 2015-05-20 ENCOUNTER — Encounter (HOSPITAL_COMMUNITY): Payer: Self-pay | Admitting: Emergency Medicine

## 2015-05-20 ENCOUNTER — Inpatient Hospital Stay (HOSPITAL_COMMUNITY): Payer: Medicare Other

## 2015-05-20 ENCOUNTER — Inpatient Hospital Stay (HOSPITAL_COMMUNITY)
Admission: EM | Admit: 2015-05-20 | Discharge: 2015-05-23 | DRG: 482 | Disposition: A | Discharge status: Skilled Nursing Facility | Payer: Medicare Other | Attending: Internal Medicine | Admitting: Internal Medicine

## 2015-05-20 ENCOUNTER — Emergency Department (HOSPITAL_COMMUNITY): Payer: Medicare Other

## 2015-05-20 ENCOUNTER — Emergency Department (HOSPITAL_COMMUNITY): Payer: Medicare Other | Admitting: Anesthesiology

## 2015-05-20 ENCOUNTER — Encounter (HOSPITAL_COMMUNITY)
Admission: EM | Disposition: A | Discharge status: Skilled Nursing Facility | Payer: Self-pay | Source: Home / Self Care | Attending: Internal Medicine

## 2015-05-20 DIAGNOSIS — Z7982 Long term (current) use of aspirin: Secondary | ICD-10-CM | POA: Diagnosis not present

## 2015-05-20 DIAGNOSIS — W1839XA Other fall on same level, initial encounter: Secondary | ICD-10-CM | POA: Diagnosis present

## 2015-05-20 DIAGNOSIS — E785 Hyperlipidemia, unspecified: Secondary | ICD-10-CM | POA: Diagnosis present

## 2015-05-20 DIAGNOSIS — K219 Gastro-esophageal reflux disease without esophagitis: Secondary | ICD-10-CM | POA: Diagnosis present

## 2015-05-20 DIAGNOSIS — I350 Nonrheumatic aortic (valve) stenosis: Secondary | ICD-10-CM | POA: Diagnosis present

## 2015-05-20 DIAGNOSIS — Z79899 Other long term (current) drug therapy: Secondary | ICD-10-CM

## 2015-05-20 DIAGNOSIS — S72001D Fracture of unspecified part of neck of right femur, subsequent encounter for closed fracture with routine healing: Secondary | ICD-10-CM | POA: Diagnosis not present

## 2015-05-20 DIAGNOSIS — Z955 Presence of coronary angioplasty implant and graft: Secondary | ICD-10-CM | POA: Diagnosis not present

## 2015-05-20 DIAGNOSIS — W19XXXA Unspecified fall, initial encounter: Secondary | ICD-10-CM | POA: Diagnosis not present

## 2015-05-20 DIAGNOSIS — H9193 Unspecified hearing loss, bilateral: Secondary | ICD-10-CM | POA: Diagnosis present

## 2015-05-20 DIAGNOSIS — F419 Anxiety disorder, unspecified: Secondary | ICD-10-CM | POA: Diagnosis present

## 2015-05-20 DIAGNOSIS — I10 Essential (primary) hypertension: Secondary | ICD-10-CM | POA: Diagnosis present

## 2015-05-20 DIAGNOSIS — R748 Abnormal levels of other serum enzymes: Secondary | ICD-10-CM | POA: Diagnosis not present

## 2015-05-20 DIAGNOSIS — I2510 Atherosclerotic heart disease of native coronary artery without angina pectoris: Secondary | ICD-10-CM | POA: Diagnosis present

## 2015-05-20 DIAGNOSIS — E780 Pure hypercholesterolemia: Secondary | ICD-10-CM | POA: Diagnosis present

## 2015-05-20 DIAGNOSIS — H547 Unspecified visual loss: Secondary | ICD-10-CM | POA: Diagnosis present

## 2015-05-20 DIAGNOSIS — Z66 Do not resuscitate: Secondary | ICD-10-CM | POA: Diagnosis present

## 2015-05-20 DIAGNOSIS — Z419 Encounter for procedure for purposes other than remedying health state, unspecified: Secondary | ICD-10-CM

## 2015-05-20 DIAGNOSIS — M79661 Pain in right lower leg: Secondary | ICD-10-CM | POA: Diagnosis not present

## 2015-05-20 DIAGNOSIS — I739 Peripheral vascular disease, unspecified: Secondary | ICD-10-CM | POA: Diagnosis present

## 2015-05-20 DIAGNOSIS — S72001A Fracture of unspecified part of neck of right femur, initial encounter for closed fracture: Principal | ICD-10-CM | POA: Diagnosis present

## 2015-05-20 DIAGNOSIS — S72009A Fracture of unspecified part of neck of unspecified femur, initial encounter for closed fracture: Secondary | ICD-10-CM | POA: Diagnosis present

## 2015-05-20 DIAGNOSIS — Z884 Allergy status to anesthetic agent status: Secondary | ICD-10-CM | POA: Diagnosis not present

## 2015-05-20 DIAGNOSIS — Y92000 Kitchen of unspecified non-institutional (private) residence as  the place of occurrence of the external cause: Secondary | ICD-10-CM | POA: Diagnosis not present

## 2015-05-20 DIAGNOSIS — Z885 Allergy status to narcotic agent status: Secondary | ICD-10-CM

## 2015-05-20 DIAGNOSIS — M1712 Unilateral primary osteoarthritis, left knee: Secondary | ICD-10-CM | POA: Diagnosis present

## 2015-05-20 DIAGNOSIS — M79662 Pain in left lower leg: Secondary | ICD-10-CM

## 2015-05-20 HISTORY — PX: HIP PINNING,CANNULATED: SHX1758

## 2015-05-20 LAB — CBC WITH DIFFERENTIAL/PLATELET
BASOS ABS: 0 10*3/uL (ref 0.0–0.1)
Basophils Relative: 1 % (ref 0–1)
Eosinophils Absolute: 0.2 10*3/uL (ref 0.0–0.7)
Eosinophils Relative: 4 % (ref 0–5)
HCT: 29.7 % — ABNORMAL LOW (ref 36.0–46.0)
Hemoglobin: 9.6 g/dL — ABNORMAL LOW (ref 12.0–15.0)
LYMPHS PCT: 18 % (ref 12–46)
Lymphs Abs: 1.1 10*3/uL (ref 0.7–4.0)
MCH: 28.1 pg (ref 26.0–34.0)
MCHC: 32.3 g/dL (ref 30.0–36.0)
MCV: 86.8 fL (ref 78.0–100.0)
Monocytes Absolute: 1 10*3/uL (ref 0.1–1.0)
Monocytes Relative: 17 % — ABNORMAL HIGH (ref 3–12)
NEUTROS PCT: 60 % (ref 43–77)
Neutro Abs: 3.5 10*3/uL (ref 1.7–7.7)
Platelets: 241 10*3/uL (ref 150–400)
RBC: 3.42 MIL/uL — ABNORMAL LOW (ref 3.87–5.11)
RDW: 14.9 % (ref 11.5–15.5)
WBC: 5.8 10*3/uL (ref 4.0–10.5)

## 2015-05-20 LAB — BASIC METABOLIC PANEL
ANION GAP: 8 (ref 5–15)
BUN: 26 mg/dL — ABNORMAL HIGH (ref 6–20)
CO2: 23 mmol/L (ref 22–32)
CREATININE: 1.41 mg/dL — AB (ref 0.44–1.00)
Calcium: 9.4 mg/dL (ref 8.9–10.3)
Chloride: 104 mmol/L (ref 101–111)
GFR calc Af Amer: 35 mL/min — ABNORMAL LOW (ref >60)
GFR calc non Af Amer: 30 mL/min — ABNORMAL LOW (ref >60)
Glucose, Bld: 109 mg/dL — ABNORMAL HIGH (ref 65–99)
Potassium: 4.1 mmol/L (ref 3.5–5.1)
SODIUM: 135 mmol/L (ref 135–145)

## 2015-05-20 LAB — TYPE AND SCREEN
ABO/RH(D): O POS
Antibody Screen: NEGATIVE

## 2015-05-20 SURGERY — FIXATION, FEMUR, NECK, PERCUTANEOUS, USING SCREW
Anesthesia: Regional | Site: Hip | Laterality: Right

## 2015-05-20 MED ORDER — DOCUSATE SODIUM 100 MG PO CAPS
100.0000 mg | ORAL_CAPSULE | Freq: Every day | ORAL | Status: DC
  Administered 2015-05-20 – 2015-05-22 (×3): 100 mg via ORAL
  Filled 2015-05-20 (×3): qty 1

## 2015-05-20 MED ORDER — LIDOCAINE HCL (CARDIAC) 20 MG/ML IV SOLN
INTRAVENOUS | Status: DC | PRN
  Administered 2015-05-20: 30 mg via INTRAVENOUS

## 2015-05-20 MED ORDER — ACETAMINOPHEN 650 MG RE SUPP: 650.0000 mg | Freq: Four times a day (QID) | RECTAL | Status: DC | PRN

## 2015-05-20 MED ORDER — TRIAMTERENE-HCTZ 37.5-25 MG PO TABS
1.0000 | ORAL_TABLET | Freq: Every day | ORAL | Status: DC
  Administered 2015-05-21: 1 via ORAL
  Filled 2015-05-20: qty 1

## 2015-05-20 MED ORDER — PREDNISOLONE ACETATE 1 % OP SUSP
1.0000 [drp] | Freq: Every day | OPHTHALMIC | Status: DC
  Administered 2015-05-21 – 2015-05-23 (×3): 1 [drp] via OPHTHALMIC
  Filled 2015-05-20: qty 1

## 2015-05-20 MED ORDER — PROPOFOL 10 MG/ML IV BOLUS
INTRAVENOUS | Status: DC | PRN
  Administered 2015-05-20 (×6): 20 mg via INTRAVENOUS

## 2015-05-20 MED ORDER — FLEET ENEMA 7-19 GM/118ML RE ENEM
1.0000 | ENEMA | Freq: Once | RECTAL | Status: AC | PRN
  Filled 2015-05-20: qty 1

## 2015-05-20 MED ORDER — FENTANYL CITRATE (PF) 250 MCG/5ML IJ SOLN
INTRAMUSCULAR | Status: AC
  Filled 2015-05-20: qty 5

## 2015-05-20 MED ORDER — ACETAMINOPHEN 325 MG PO TABS
650.0000 mg | ORAL_TABLET | Freq: Four times a day (QID) | ORAL | Status: DC | PRN
  Administered 2015-05-21 – 2015-05-23 (×6): 650 mg via ORAL
  Filled 2015-05-20 (×6): qty 2

## 2015-05-20 MED ORDER — CEFAZOLIN SODIUM-DEXTROSE 2-3 GM-% IV SOLR
INTRAVENOUS | Status: DC | PRN
  Administered 2015-05-20: 2 g via INTRAVENOUS

## 2015-05-20 MED ORDER — ETOMIDATE 2 MG/ML IV SOLN
INTRAVENOUS | Status: AC
  Filled 2015-05-20: qty 10

## 2015-05-20 MED ORDER — MORPHINE SULFATE 2 MG/ML IJ SOLN: 0.5000 mg | INTRAMUSCULAR | Status: DC | PRN

## 2015-05-20 MED ORDER — ENOXAPARIN SODIUM 30 MG/0.3ML ~~LOC~~ SOLN
30.0000 mg | SUBCUTANEOUS | Status: DC
  Administered 2015-05-21 – 2015-05-23 (×3): 30 mg via SUBCUTANEOUS
  Filled 2015-05-20 (×4): qty 0.3

## 2015-05-20 MED ORDER — PHENOL 1.4 % MT LIQD
1.0000 | OROMUCOSAL | Status: DC | PRN
  Filled 2015-05-20: qty 177

## 2015-05-20 MED ORDER — FENTANYL CITRATE (PF) 100 MCG/2ML IJ SOLN
25.0000 ug | INTRAMUSCULAR | Status: DC | PRN
  Administered 2015-05-20: 25 ug via INTRAVENOUS

## 2015-05-20 MED ORDER — VITAMIN D 1000 UNITS PO TABS
1000.0000 [IU] | ORAL_TABLET | Freq: Every day | ORAL | Status: DC
Start: 2015-05-21 — End: 2015-05-23
  Administered 2015-05-21 – 2015-05-23 (×3): 1000 [IU] via ORAL
  Filled 2015-05-20 (×3): qty 1

## 2015-05-20 MED ORDER — ALPRAZOLAM 0.25 MG PO TABS
0.2500 mg | ORAL_TABLET | Freq: Every day | ORAL | Status: DC | PRN
Start: 2015-05-20 — End: 2015-05-23
  Administered 2015-05-20 – 2015-05-22 (×2): 0.25 mg via ORAL
  Filled 2015-05-20 (×2): qty 1

## 2015-05-20 MED ORDER — ESCITALOPRAM OXALATE 10 MG PO TABS
10.0000 mg | ORAL_TABLET | Freq: Every day | ORAL | Status: DC
  Administered 2015-05-21 – 2015-05-23 (×3): 10 mg via ORAL
  Filled 2015-05-20 (×3): qty 1

## 2015-05-20 MED ORDER — PANTOPRAZOLE SODIUM 40 MG PO TBEC
80.0000 mg | DELAYED_RELEASE_TABLET | Freq: Every day | ORAL | Status: DC
  Administered 2015-05-21 – 2015-05-23 (×3): 80 mg via ORAL
  Filled 2015-05-20 (×3): qty 2

## 2015-05-20 MED ORDER — CALCITONIN (SALMON) 200 UNIT/ACT NA SOLN
1.0000 | Freq: Every day | NASAL | Status: DC
  Administered 2015-05-21 – 2015-05-23 (×3): 1 via NASAL
  Filled 2015-05-20: qty 3.7

## 2015-05-20 MED ORDER — SODIUM CHLORIDE 0.9 % IV SOLN
INTRAVENOUS | Status: DC
  Administered 2015-05-20: 17:00:00 via INTRAVENOUS

## 2015-05-20 MED ORDER — ASPIRIN EC 81 MG PO TBEC
81.0000 mg | DELAYED_RELEASE_TABLET | Freq: Every day | ORAL | Status: DC
  Administered 2015-05-21 – 2015-05-23 (×3): 81 mg via ORAL
  Filled 2015-05-20 (×3): qty 1

## 2015-05-20 MED ORDER — BUPIVACAINE HCL 0.25 % IJ SOLN
INTRAMUSCULAR | Status: DC | PRN
  Administered 2015-05-20: 30 mL

## 2015-05-20 MED ORDER — REFRESH P.M. OP OINT: 1.0000 "application " | TOPICAL_OINTMENT | Freq: Four times a day (QID) | OPHTHALMIC | Status: DC

## 2015-05-20 MED ORDER — CHOLECALCIFEROL 25 MCG (1000 UT) PO TABS: 1000.0000 [IU] | ORAL_TABLET | Freq: Every day | ORAL | Status: DC

## 2015-05-20 MED ORDER — DEXTROSE 5 % IV SOLN
10.0000 mg | INTRAVENOUS | Status: DC | PRN
  Administered 2015-05-20: 10 ug/min via INTRAVENOUS

## 2015-05-20 MED ORDER — HYDROCODONE-ACETAMINOPHEN 5-325 MG PO TABS
1.0000 | ORAL_TABLET | Freq: Four times a day (QID) | ORAL | Status: DC | PRN
  Administered 2015-05-21 – 2015-05-23 (×2): 1 via ORAL
  Filled 2015-05-20 (×2): qty 1

## 2015-05-20 MED ORDER — FENTANYL CITRATE (PF) 100 MCG/2ML IJ SOLN
INTRAMUSCULAR | Status: AC
  Filled 2015-05-20: qty 2

## 2015-05-20 MED ORDER — SUCCINYLCHOLINE CHLORIDE 20 MG/ML IJ SOLN
INTRAMUSCULAR | Status: AC
  Filled 2015-05-20: qty 1

## 2015-05-20 MED ORDER — LOSARTAN POTASSIUM 50 MG PO TABS
50.0000 mg | ORAL_TABLET | Freq: Every day | ORAL | Status: DC
  Administered 2015-05-21 – 2015-05-23 (×3): 50 mg via ORAL
  Filled 2015-05-20 (×3): qty 1

## 2015-05-20 MED ORDER — LACTATED RINGERS IV SOLN
INTRAVENOUS | Status: DC | PRN
  Administered 2015-05-20: 20:00:00 via INTRAVENOUS

## 2015-05-20 MED ORDER — ROCURONIUM BROMIDE 50 MG/5ML IV SOLN
INTRAVENOUS | Status: AC
  Filled 2015-05-20: qty 1

## 2015-05-20 MED ORDER — TRAMADOL HCL 50 MG PO TABS
50.0000 mg | ORAL_TABLET | Freq: Every day | ORAL | Status: DC
  Administered 2015-05-20 – 2015-05-22 (×3): 50 mg via ORAL
  Filled 2015-05-20 (×3): qty 1

## 2015-05-20 MED ORDER — MEPERIDINE HCL 25 MG/ML IJ SOLN: 6.2500 mg | INTRAMUSCULAR | Status: DC | PRN

## 2015-05-20 MED ORDER — FENTANYL CITRATE (PF) 250 MCG/5ML IJ SOLN
INTRAMUSCULAR | Status: DC | PRN
  Administered 2015-05-20 (×2): 25 ug via INTRAVENOUS

## 2015-05-20 MED ORDER — MENTHOL 3 MG MT LOZG
1.0000 | LOZENGE | OROMUCOSAL | Status: DC | PRN
  Filled 2015-05-20: qty 9

## 2015-05-20 MED ORDER — ARTIFICIAL TEARS OP OINT
TOPICAL_OINTMENT | Freq: Four times a day (QID) | OPHTHALMIC | Status: DC
  Administered 2015-05-20 – 2015-05-22 (×6): via OPHTHALMIC
  Filled 2015-05-20: qty 3.5

## 2015-05-20 MED ORDER — ISOSORBIDE MONONITRATE ER 30 MG PO TB24
30.0000 mg | ORAL_TABLET | Freq: Every day | ORAL | Status: DC
  Administered 2015-05-21 – 2015-05-23 (×3): 30 mg via ORAL
  Filled 2015-05-20 (×3): qty 1

## 2015-05-20 MED ORDER — CEFAZOLIN SODIUM-DEXTROSE 2-3 GM-% IV SOLR
INTRAVENOUS | Status: AC
Start: 2015-05-20 — End: 2015-05-20
  Filled 2015-05-20: qty 100

## 2015-05-20 MED ORDER — ONDANSETRON HCL 4 MG PO TABS: 4.0000 mg | ORAL_TABLET | Freq: Four times a day (QID) | ORAL | Status: DC | PRN

## 2015-05-20 MED ORDER — DILTIAZEM HCL ER BEADS 300 MG PO CP24
300.0000 mg | ORAL_CAPSULE | Freq: Every day | ORAL | Status: DC
  Administered 2015-05-21: 300 mg via ORAL
  Filled 2015-05-20: qty 1

## 2015-05-20 MED ORDER — BUPIVACAINE HCL 0.5 % IJ SOLN: INTRAMUSCULAR | Status: DC | PRN

## 2015-05-20 MED ORDER — POLYETHYLENE GLYCOL 3350 17 G PO PACK
17.0000 g | PACK | Freq: Every day | ORAL | Status: DC | PRN
  Administered 2015-05-22: 17 g via ORAL
  Filled 2015-05-20 (×2): qty 1

## 2015-05-20 MED ORDER — MORPHINE SULFATE 2 MG/ML IJ SOLN
2.0000 mg | Freq: Once | INTRAMUSCULAR | Status: AC
Start: 2015-05-20 — End: 2015-05-20
  Administered 2015-05-20: 2 mg via INTRAVENOUS
  Filled 2015-05-20: qty 1

## 2015-05-20 MED ORDER — BISACODYL 10 MG RE SUPP: 10.0000 mg | Freq: Every day | RECTAL | Status: DC | PRN

## 2015-05-20 MED ORDER — ONDANSETRON HCL 4 MG/2ML IJ SOLN: 4.0000 mg | Freq: Four times a day (QID) | INTRAMUSCULAR | Status: DC | PRN

## 2015-05-20 MED ORDER — METOCLOPRAMIDE HCL 5 MG/ML IJ SOLN
5.0000 mg | Freq: Three times a day (TID) | INTRAMUSCULAR | Status: DC | PRN
  Filled 2015-05-20: qty 1

## 2015-05-20 MED ORDER — DEXTROSE-NACL 5-0.45 % IV SOLN
INTRAVENOUS | Status: DC
  Administered 2015-05-20: 22:00:00 via INTRAVENOUS

## 2015-05-20 MED ORDER — METOCLOPRAMIDE HCL 5 MG PO TABS
5.0000 mg | ORAL_TABLET | Freq: Three times a day (TID) | ORAL | Status: DC | PRN
  Filled 2015-05-20: qty 1

## 2015-05-20 MED ORDER — SIMETHICONE 80 MG PO CHEW
80.0000 mg | CHEWABLE_TABLET | Freq: Four times a day (QID) | ORAL | Status: DC | PRN
  Filled 2015-05-20: qty 1

## 2015-05-20 MED ORDER — BUPIVACAINE HCL (PF) 0.25 % IJ SOLN
INTRAMUSCULAR | Status: AC
  Filled 2015-05-20: qty 30

## 2015-05-20 SURGICAL SUPPLY — 36 items
BIT DRILL CANNULATED 5.0MM (BIT) ×2 IMPLANT
COVER PERINEAL POST (MISCELLANEOUS) ×3 IMPLANT
COVER SURGICAL LIGHT HANDLE (MISCELLANEOUS) ×3 IMPLANT
DRAPE STERI IOBAN 125X83 (DRAPES) ×3 IMPLANT
DRSG MEPILEX BORDER 4X4 (GAUZE/BANDAGES/DRESSINGS) ×3 IMPLANT
DURAPREP 26ML APPLICATOR (WOUND CARE) ×3 IMPLANT
ELECT REM PT RETURN 9FT ADLT (ELECTROSURGICAL) ×3
ELECTRODE REM PT RTRN 9FT ADLT (ELECTROSURGICAL) ×1 IMPLANT
GLOVE BIOGEL PI IND STRL 8 (GLOVE) ×2 IMPLANT
GLOVE BIOGEL PI INDICATOR 8 (GLOVE) ×4
GLOVE ORTHO TXT STRL SZ7.5 (GLOVE) ×6 IMPLANT
GOWN STRL REUS W/ TWL LRG LVL3 (GOWN DISPOSABLE) ×2 IMPLANT
GOWN STRL REUS W/TWL 2XL LVL3 (GOWN DISPOSABLE) ×3 IMPLANT
GOWN STRL REUS W/TWL LRG LVL3 (GOWN DISPOSABLE) ×6
GUIDE PIN 3.2MM (PIN) ×2 IMPLANT
KIT BASIN OR (CUSTOM PROCEDURE TRAY) ×3 IMPLANT
KIT ROOM TURNOVER OR (KITS) ×3 IMPLANT
LINER BOOT UNIVERSAL DISP (MISCELLANEOUS) ×3 IMPLANT
MANIFOLD NEPTUNE II (INSTRUMENTS) ×3 IMPLANT
NDL HYPO 25GX1X1/2 BEV (NEEDLE) IMPLANT
NEEDLE HYPO 25GX1X1/2 BEV (NEEDLE) IMPLANT
NS IRRIG 1000ML POUR BTL (IV SOLUTION) ×3 IMPLANT
PACK GENERAL/GYN (CUSTOM PROCEDURE TRAY) ×3 IMPLANT
PAD ARMBOARD 7.5X6 YLW CONV (MISCELLANEOUS) ×6 IMPLANT
SCREW CANNULATED 6.5X70MM (Screw) ×2 IMPLANT
SCREW CANNULATED 6.5X75MM (Screw) ×2 IMPLANT
SCREW CANNULATED 6.5X80MM (Screw) ×2 IMPLANT
STAPLER VISISTAT 35W (STAPLE) ×3 IMPLANT
SUT VIC AB 0 CT1 27 (SUTURE) ×3
SUT VIC AB 0 CT1 27XBRD ANBCTR (SUTURE) ×1 IMPLANT
SUT VIC AB 2-0 CT1 27 (SUTURE) ×3
SUT VIC AB 2-0 CT1 TAPERPNT 27 (SUTURE) ×1 IMPLANT
SYR CONTROL 10ML LL (SYRINGE) IMPLANT
TOWEL OR 17X24 6PK STRL BLUE (TOWEL DISPOSABLE) ×3 IMPLANT
TOWEL OR 17X26 10 PK STRL BLUE (TOWEL DISPOSABLE) ×3 IMPLANT
WATER STERILE IRR 1000ML POUR (IV SOLUTION) ×3 IMPLANT

## 2015-05-20 NOTE — H&P (Addendum)
History and Physical  Victoria MinervaRuby P Able ZOX:[id number] DOB: Jul 15, 1915 DOA: 05/20/2015  Referring physician: EDP PCP: Minda MeoARONSON,RICHARD A, MD   Chief Complaint: mechanical fall with right hip pain  HPI: Victoria Juarez is a 81 y.o. female  Patient here after mechanical fall yesterday. No head injury. No chest or abdominal trauma. Complains of sharp pain in her right hip that is worse with walking or standing. She does use a walker normally and can still do this but with great discomfort. Has used Tylenol with limited relief. Denies numbness or tingling to her right foot. Symptoms are better with rest.  Patient is a 81 y.o. female presenting with fall and hip pain. The history is provided by the patient.   ED course: ekg /cxr no acute changes, hip x ray showed mild displaced proximal right femoral neck fracture. Basic labs showed mild cr elevation at 1.4, hgb at 9.6, otherwise unremarkable. EDP [facility] Dr. Ophelia CharterYates. patient and family requested to proceed to surgery after discussion with ortho.  patient , though hard of hearing,  is a fairly good historian, denies pain, no sob, no headache, no fever, no edema.     Review of Systems:  Detail per HPI, Review of systems are otherwise negative  Past Medical History  Diagnosis Date  . Hypertension   . Hearing loss     "both ears"  . Vision loss, bilateral   . High cholesterol   . Heart murmur   . Anginal pain   . Coronary artery disease   . GERD (gastroesophageal reflux disease)   . History of hiatal hernia   . Headache     "weekly" (12/26/2014)  . Arthritis     "all over" (12/26/2014)  . Chronic lower back pain   . Kidney stone     Patient has distention on the right side from scar tissue from a past embedded kidney stone.   . Trichiasis     "of lashes; gets this done q 3 months or so" (12/26/2014)  . HCAP (healthcare-associated pneumonia) 12/26/2014   Past Surgical History  Procedure Laterality Date  . Kidney stone  surgery  1970's    "imbedded stone; had to cut her to get it out"  . Coronary angioplasty with stent placement  ~ 1998; ~ 2002    "1; 1"   . Dilation and curettage of uterus    . Entropian repair Right 01/01/2004    "lower eyelid"  . Ectropion repair Left 11/06/03    "lower eyelid"  . Entropian repair Bilateral     "lower"  . Punctoplasty Bilateral     "lower eyelids"  . Cataract extraction w/ intraocular lens implant Bilateral 2000's  . Eye surgery Bilateral 2000's    "to prevent lashes growing inward; had upper and lower on one side; upper only on the other; has to have eyelashes plucked q 3 months or so" (12/26/2014(  . Eye surgery Bilateral     repair retractor   Social History:  reports that she has never smoked. She has never used smokeless tobacco. She reports that she does not drink alcohol or use illicit drugs. Patient lives at  Assisted living & is able to participate in activities of daily living with a walker.  Allergies  Allergen Reactions  . Codeine Other (See Comments)    "makes her very sick"  . Novocain [Procaine] Other (See Comments)    Unknown reaction    History reviewed. No pertinent family history.    Prior  to Admission medications   Medication Sig Start Date End Date Taking? Authorizing Provider  acetaminophen (TYLENOL) 325 MG tablet Take 650 mg by mouth 2 (two) times daily.   Yes Historical Provider, MD  ALPRAZolam (XANAX) 0.25 MG tablet Take 1 tablet (0.25 mg total) by mouth daily as needed for sleep or anxiety. Patient taking differently: Take 0.25 mg by mouth at bedtime.  02/21/13  Yes Geoffry Paradise, MD  Artificial Tear Ointment (ARTIFICIAL TEARS) ointment Place 1 application into both eyes 4 (four) times daily.    Yes Historical Provider, MD  Artificial Tear Ointment (REFRESH P.M. OP) Apply 1 application to eye at bedtime.   Yes Historical Provider, MD  aspirin EC 81 MG EC tablet Take 1 tablet (81 mg total) by mouth daily. 02/21/13  Yes Geoffry Paradise, MD  Cholecalciferol 1000 UNITS tablet Take 1,000 Units by mouth daily.   Yes Historical Provider, MD  diltiazem (TIAZAC) 300 MG 24 hr capsule Take 300 mg by mouth daily.   Yes Historical Provider, MD  docusate sodium (COLACE) 100 MG capsule Take 100 mg by mouth at bedtime.    Yes Historical Provider, MD  escitalopram (LEXAPRO) 10 MG tablet Take 10 mg by mouth daily. 10/13/14  Yes Historical Provider, MD  HORSE CHESTNUT PO Take 1 capsule by mouth at bedtime. For leg pain   Yes Historical Provider, MD  isosorbide mononitrate (IMDUR) 30 MG 24 hr tablet Take 30 mg by mouth daily.   Yes Historical Provider, MD  losartan (COZAAR) 50 MG tablet Take 50 mg by mouth daily. 09/22/14  Yes Historical Provider, MD  omeprazole (PRILOSEC) 40 MG capsule Take 40 mg by mouth daily.   Yes Historical Provider, MD  prednisoLONE acetate (PRED FORTE) 1 % ophthalmic suspension Place 1 drop into both eyes daily.   Yes Historical Provider, MD  simethicone (MYLICON) 125 MG chewable tablet Chew 125 mg by mouth 2 (two) times daily.   Yes Historical Provider, MD  traMADol (ULTRAM) 50 MG tablet Take 50 mg by mouth at bedtime.   Yes Historical Provider, MD  triamterene-hydrochlorothiazide (MAXZIDE-25) 37.5-25 MG per tablet Take 1 tablet by mouth daily. 10/28/14  Yes Historical Provider, MD  calcitonin, salmon, (MIACALCIN/FORTICAL) 200 UNIT/ACT nasal spray Place 1 spray into the nose daily. Patient not taking: Reported on 12/26/2014 02/21/13   Geoffry Paradise, MD  carisoprodol (SOMA) 350 MG tablet Take 1 tablet (350 mg total) by mouth 3 (three) times daily as needed (spasm). Patient not taking: Reported on 12/26/2014 02/21/13   Geoffry Paradise, MD  levofloxacin (LEVAQUIN) 250 MG tablet Take 1 tablet (250 mg total) by mouth daily. 12/30/14   Geoffry Paradise, MD  polyethylene glycol [facility] Ethelene Hal) packet Take 17 g by mouth daily as needed. 11/10/14   Joseph Art, DO  sucralfate (CARAFATE) 1 GM/10ML suspension Take 10 mLs  (1 g total) by mouth 4 (four) times daily -  with meals and at bedtime. Patient not taking: Reported on 12/26/2014 11/10/14   Joseph Art, DO    Physical Exam: BP 128/49 mmHg  Pulse 66  Temp(Src) 98.5 F (36.9 C) (Oral)  Resp 21  SpO2 96%  General:  Frail, NAD, Hard of hearing Eyes: PERRL ENT: unremarkable Neck: supple, no JVD Cardiovascular: RRR, + 2/6 murmur at right upper sternal border and apex Respiratory: CTABL, (mild crackles at right lower bases, possible atelectasis, no cxr findings) Abdomen: soft/ND/ND, positive bowel sounds Skin: no rash, scattered ecchymosis ( reported due to fall),  Musculoskeletal:  Right leg slightly shortened and slightly internally rotated. No edema, distal pulse intact Psychiatric: calm/cooperative Neurologic: no focal findings            Labs on Admission:  Basic Metabolic Panel:  Recent Labs Lab 05/20/15 1630  NA 135  K 4.1  CL 104  CO2 23  GLUCOSE 109*  BUN 26*  CREATININE 1.41*  CALCIUM 9.4   Liver Function Tests: No results for input(s): AST, ALT, ALKPHOS, BILITOT, PROT, ALBUMIN in the last 168 hours. No results for input(s): LIPASE, AMYLASE in the last 168 hours. No results for input(s): AMMONIA in the last 168 hours. CBC:  Recent Labs Lab 05/20/15 1630  WBC 5.8  NEUTROABS 3.5  HGB 9.6*  HCT 29.7*  MCV 86.8  PLT 241   Cardiac Enzymes: No results for input(s): CKTOTAL, CKMB, CKMBINDEX, TROPONINI in the last 168 hours.  BNP (last 3 results) No results for input(s): BNP in the last 8760 hours.  ProBNP (last 3 results) No results for input(s): PROBNP in the last 8760 hours.  CBG: No results for input(s): GLUCAP in the last 168 hours.  Radiological Exams on Admission: Dg Hip Unilat With Pelvis 2-3 Views Right  05/20/2015   CLINICAL DATA:  Acute right hip pain after fall yesterday. Initial encounter.  EXAM: DG HIP (WITH OR WITHOUT PELVIS) 2-3V RIGHT  COMPARISON:  None.  FINDINGS: Mildly displaced proximal neck  fracture is seen involving the proximal right femur. Femoral head is situated within the acetabulum. This appears to be closed and posttraumatic.  IMPRESSION: Mildly displaced proximal right femoral neck fracture.   Electronically Signed   By: Lupita Raider, M.D.   On: 05/20/2015 15:49    EKG: Independently reviewed. Sinu rhythm, no acute ST/T changes, QTc wnl.  Assessment/Plan Present on Admission:  **None**  Right femoral neck fracture: appreciate [facility] Dr. Ophelia Charter input. Dr Ophelia Charter talked to family , family requested to proceed with surgery today. Patient is moderate to high risk for intermediate risk surgery due to advanced age, frailty, cr 1,4.  Last echocardiogram in 10/2014 showed LVEF 60%, moderate to severe aortic stenosis, moderate mitral regurgitation.t no acute EKG or cxr findings on presentation. Will need close monitoring perioperatively.  HTN, well controlled, resume home meds post op, prn hydralazine for sbp>170  H/o CAD, no mention of stent, no mention of heart attack ,cxr/ekg unremarkable, denies chest pain, no sob.     DVT prophylaxis: scd for now, post op prophylaxis to be determined by ortho  Consultants: [facility], Dr Ophelia Charter  Code Status: full , presumed, family not at bedside  Family Communication:  Patient   Disposition Plan: admit to tele  Time spent:  Deniyah Dillavou MD, PhD [facility] Pager [phone] If 7PM-7AM, please contact night-coverage at [email or web address], password [facility]

## 2015-05-20 NOTE — Interval H&P Note (Signed)
History and Physical Interval Note:  05/20/2015 7:21 PM  Victoria Juarez  has presented today for surgery, with the diagnosis of right impacted femoral neck fracture  The various methods of treatment have been discussed with the patient and family. After consideration of risks, benefits and other options for treatment, the patient has consented to  Procedure(s): CANNULATED HIP PINNING (Right) as a surgical intervention .  The patient's history has been reviewed, patient examined, no change in status, stable for surgery.  I have reviewed the patient's chart and labs.  Questions were answered to the patient's satisfaction.     Javel Hersh C

## 2015-05-20 NOTE — ED Notes (Signed)
Pt is still at XRAY.  

## 2015-05-20 NOTE — Anesthesia Preprocedure Evaluation (Addendum)
Anesthesia Evaluation  Patient identified by MRN, date of birth, ID band Patient awake    Reviewed: Allergy & Precautions, NPO status , Patient's Chart, lab work & pertinent test results  Airway Mallampati: II  TM Distance: >3 FB Neck ROM: Full    Dental  (+) Teeth Intact, Dental Advisory Given   Pulmonary  breath sounds clear to auscultation        Cardiovascular hypertension, Pt. on medications + angina with exertion + CAD and + Peripheral Vascular Disease + Valvular Problems/Murmurs AS Rhythm:Regular + Systolic murmurs    Neuro/Psych    GI/Hepatic hiatal hernia, GERD-  Medicated and Controlled,  Endo/Other    Renal/GU Renal disease     Musculoskeletal   Abdominal   Peds  Hematology   Anesthesia Other Findings Plan to place a fascia lata block at case conclusion.  Reproductive/Obstetrics                         Anesthesia Physical Anesthesia Plan  ASA: IV and emergent  Anesthesia Plan: General and Regional   Post-op Pain Management: MAC Combined w/ Regional for Post-op pain   Induction: Intravenous  Airway Management Planned: LMA  Additional Equipment: Arterial line  Intra-op Plan:   Post-operative Plan: Extubation in OR  Informed Consent: I have reviewed the patients History and Physical, chart, labs and discussed the procedure including the risks, benefits and alternatives for the proposed anesthesia with the patient or authorized representative who has indicated his/her understanding and acceptance.   Dental advisory given  Plan Discussed with: CRNA, Anesthesiologist and Surgeon  Anesthesia Plan Comments:       Anesthesia Quick Evaluation

## 2015-05-20 NOTE — ED Notes (Signed)
Pt. Stated, I fell yesterday and I hurt all over but my rt. Hip is protruding and it hurts.  Pt normally  Walks with a walker

## 2015-05-20 NOTE — Anesthesia Postprocedure Evaluation (Signed)
  Anesthesia Post-op Note  Patient: Victoria Juarez  Procedure(s) Performed: Procedure(s): CANNULATED HIP PINNING (Right)  Patient Location: PACU  Anesthesia Type: General, Regional   Level of Consciousness: awake, alert  and oriented  Airway and Oxygen Therapy: Patient Spontanous Breathing  Post-op Pain: mild  Post-op Assessment: Post-op Vital signs reviewed  Post-op Vital Signs: Reviewed  Last Vitals:  Filed Vitals:   05/20/15 2115  BP:   Pulse: 65  Temp:   Resp: 19    Complications: No apparent anesthesia complications and pt will go to a step down unit for continued close BP control using her arterial line overnight.

## 2015-05-20 NOTE — Consult Note (Addendum)
Reason for Consult:eright femoral neck fracture   Referring Physician: Zenia Resides MD  Victoria Juarez is an 81 y.o. female.  HPI: 81 yo independent living [place] fell in her kitchen taking plate back to her kitchen with right hip pain and inablility to walk. Neg LOC, pain with right hip motion. Left knee has OA and gave way causing her to fall. She has been working with PT to improve ambulation.   Past Medical History  Diagnosis Date  . Hypertension   . Hearing loss     "both ears"  . Vision loss, bilateral   . High cholesterol   . Heart murmur   . Anginal pain   . Coronary artery disease   . GERD (gastroesophageal reflux disease)   . History of hiatal hernia   . Headache     "weekly" (12/26/2014)  . Arthritis     "all over" (12/26/2014)  . Chronic lower back pain   . Kidney stone     Patient has distention on the right side from scar tissue from a past embedded kidney stone.   . Trichiasis     "of lashes; gets this done q 3 months or so" (12/26/2014)  . HCAP (healthcare-associated pneumonia) 12/26/2014    Past Surgical History  Procedure Laterality Date  . Kidney stone surgery  1970's    "imbedded stone; had to cut her to get it out"  . Coronary angioplasty with stent placement  ~ 1998; ~ 2002    "1; 1"   . Dilation and curettage of uterus    . Entropian repair Right 01/01/2004    "lower eyelid"  . Ectropion repair Left 11/06/03    "lower eyelid"  . Entropian repair Bilateral     "lower"  . Punctoplasty Bilateral     "lower eyelids"  . Cataract extraction w/ intraocular lens implant Bilateral 2000's  . Eye surgery Bilateral 2000's    "to prevent lashes growing inward; had upper and lower on one side; upper only on the other; has to have eyelashes plucked q 3 months or so" (12/26/2014(  . Eye surgery Bilateral     repair retractor    No family history on file.  Social History:  reports that she has never smoked. She has never used smokeless tobacco. She reports that  she does not drink alcohol or use illicit drugs.  Allergies:  Allergies  Allergen Reactions  . Codeine Other (See Comments)    "makes her very sick"  . Novocain [Procaine] Other (See Comments)    Unknown reaction    Medications: I have reviewed the patient's current medications.  Results for orders placed or performed during the hospital encounter of 05/20/15 (from the past 48 hour(s))  CBC with Differential/Platelet     Status: Abnormal   Collection Time: 05/20/15  4:30 PM  Result Value Ref Range   WBC 5.8 4.0 - 10.5 K/uL   RBC 3.42 (L) 3.87 - 5.11 MIL/uL   Hemoglobin 9.6 (L) 12.0 - 15.0 g/dL   HCT 29.7 (L) 36.0 - 46.0 %   MCV 86.8 78.0 - 100.0 fL   MCH 28.1 26.0 - 34.0 pg   MCHC 32.3 30.0 - 36.0 g/dL   RDW 14.9 11.5 - 15.5 %   Platelets 241 150 - 400 K/uL   Neutrophils Relative % 60 43 - 77 %   Neutro Abs 3.5 1.7 - 7.7 K/uL   Lymphocytes Relative 18 12 - 46 %   Lymphs Abs 1.1 0.7 -  4.0 K/uL   Monocytes Relative 17 (H) 3 - 12 %   Monocytes Absolute 1.0 0.1 - 1.0 K/uL   Eosinophils Relative 4 0 - 5 %   Eosinophils Absolute 0.2 0.0 - 0.7 K/uL   Basophils Relative 1 0 - 1 %   Basophils Absolute 0.0 0.0 - 0.1 K/uL  Basic metabolic panel     Status: Abnormal   Collection Time: 05/20/15  4:30 PM  Result Value Ref Range   Sodium 135 135 - 145 mmol/L   Potassium 4.1 3.5 - 5.1 mmol/L   Chloride 104 101 - 111 mmol/L   CO2 23 22 - 32 mmol/L   Glucose, Bld 109 (H) 65 - 99 mg/dL   BUN 26 (H) 6 - 20 mg/dL   Creatinine, Ser 1.41 (H) 0.44 - 1.00 mg/dL   Calcium 9.4 8.9 - 10.3 mg/dL   GFR calc non Af Amer 30 (L) >60 mL/min   GFR calc Af Amer 35 (L) >60 mL/min    Comment: (NOTE) The eGFR has been calculated using the CKD EPI equation. This calculation has not been validated in all clinical situations. eGFR's persistently <60 mL/min signify possible Chronic Kidney Disease.    Anion gap 8 5 - 15  Type and screen for Red Blood Exchange     Status: None   Collection Time: 05/20/15   4:30 PM  Result Value Ref Range   ABO/RH(D) O POS    Antibody Screen NEG    Sample Expiration 05/23/2015     Dg Hip Unilat With Pelvis 2-3 Views Right  05/20/2015   CLINICAL DATA:  Acute right hip pain after fall yesterday. Initial encounter.  EXAM: DG HIP (WITH OR WITHOUT PELVIS) 2-3V RIGHT  COMPARISON:  None.  FINDINGS: Mildly displaced proximal neck fracture is seen involving the proximal right femur. Femoral head is situated within the acetabulum. This appears to be closed and posttraumatic.  IMPRESSION: Mildly displaced proximal right femoral neck fracture.   Electronically Signed   By: Marijo Conception, M.D.   On: 05/20/2015 15:49    Review of Systems  Constitutional: Positive for fever. Negative for chills.  HENT: Positive for hearing loss.   Cardiovascular: Negative for chest pain and palpitations.  Gastrointestinal: Negative for vomiting.  Musculoskeletal: Positive for joint pain.       Bilat knee OA with varus deformity  Neurological: Positive for weakness. Negative for dizziness, tingling and headaches.   Blood pressure 128/49, pulse 66, temperature 98.5 F (36.9 C), temperature source Oral, resp. rate 21, SpO2 96 %. Physical Exam  Constitutional: She appears well-developed and well-nourished.  HENT:  Head: Normocephalic and atraumatic.  Eyes: Pupils are equal, round, and reactive to light.  glasses  Neck: Normal range of motion. Neck supple. No thyromegaly present.  Cardiovascular: Normal rate.   Respiratory: Effort normal and breath sounds normal.  GI: Soft.  Musculoskeletal:  Pain with right hip ROM. Skin normal bilat varus knees with minimal effusion. Palp pulses  Skin: Skin is warm and dry. No rash noted.  Psychiatric: She has a normal mood and affect.    Assessment/Plan: inpacted  Right femoral neck fracture,. Discussed with pt and with daughter and SIL options. Recommend perc screw fixation which is the safest, quickest treatment. She will need SNF for a few  weeks but should be able to FWB again after 6 to 8 wks.  Risks of failure of fixation , reoperation, risks associated with her advanced age discussed. They request we proceed.  Damyn Weitzel C 05/20/2015, 5:37 PM   She is at high risk due to severe aortic stenosis and with anesthesia risk of sudden death with heart failure.

## 2015-05-20 NOTE — Progress Notes (Signed)
Change in level of care:   Received call from RN in PACU. At the request of Dr Ophelia CharterYates will change pt's level of care to SDU. Please see his note.   Leanne ChangKatherine P. Schorr, NP-C [facility] Pager [phone]

## 2015-05-20 NOTE — H&P (View-Only) (Signed)
Reason for Consult:eright femoral neck fracture   Referring Physician: Zenia Resides Juarez  Victoria Juarez is an 81 y.o. female.  HPI: 81 yo independent living [place] fell in her kitchen taking plate back to her kitchen with right hip pain and inablility to walk. Neg LOC, pain with right hip motion. Left knee has OA and gave way causing her to fall. She has been working with PT to improve ambulation.   Past Medical History  Diagnosis Date  . Hypertension   . Hearing loss     "both ears"  . Vision loss, bilateral   . High cholesterol   . Heart murmur   . Anginal pain   . Coronary artery disease   . GERD (gastroesophageal reflux disease)   . History of hiatal hernia   . Headache     "weekly" (12/26/2014)  . Arthritis     "all over" (12/26/2014)  . Chronic lower back pain   . Kidney stone     Patient has distention on the right side from scar tissue from a past embedded kidney stone.   . Trichiasis     "of lashes; gets this done q 3 months or so" (12/26/2014)  . HCAP (healthcare-associated pneumonia) 12/26/2014    Past Surgical History  Procedure Laterality Date  . Kidney stone surgery  1970's    "imbedded stone; had to cut her to get it out"  . Coronary angioplasty with stent placement  ~ 1998; ~ 2002    "1; 1"   . Dilation and curettage of uterus    . Entropian repair Right 01/01/2004    "lower eyelid"  . Ectropion repair Left 11/06/03    "lower eyelid"  . Entropian repair Bilateral     "lower"  . Punctoplasty Bilateral     "lower eyelids"  . Cataract extraction w/ intraocular lens implant Bilateral 2000's  . Eye surgery Bilateral 2000's    "to prevent lashes growing inward; had upper and lower on one side; upper only on the other; has to have eyelashes plucked q 3 months or so" (12/26/2014(  . Eye surgery Bilateral     repair retractor    No family history on file.  Social History:  reports that she has never smoked. She has never used smokeless tobacco. She reports that  she does not drink alcohol or use illicit drugs.  Allergies:  Allergies  Allergen Reactions  . Codeine Other (See Comments)    "makes her very sick"  . Novocain [Procaine] Other (See Comments)    Unknown reaction    Medications: I have reviewed the patient's current medications.  Results for orders placed or performed during the hospital encounter of 05/20/15 (from the past 48 hour(s))  CBC with Differential/Platelet     Status: Abnormal   Collection Time: 05/20/15  4:30 PM  Result Value Ref Range   WBC 5.8 4.0 - 10.5 K/uL   RBC 3.42 (L) 3.87 - 5.11 MIL/uL   Hemoglobin 9.6 (L) 12.0 - 15.0 g/dL   HCT 29.7 (L) 36.0 - 46.0 %   MCV 86.8 78.0 - 100.0 fL   MCH 28.1 26.0 - 34.0 pg   MCHC 32.3 30.0 - 36.0 g/dL   RDW 14.9 11.5 - 15.5 %   Platelets 241 150 - 400 K/uL   Neutrophils Relative % 60 43 - 77 %   Neutro Abs 3.5 1.7 - 7.7 K/uL   Lymphocytes Relative 18 12 - 46 %   Lymphs Abs 1.1 0.7 -  4.0 K/uL   Monocytes Relative 17 (H) 3 - 12 %   Monocytes Absolute 1.0 0.1 - 1.0 K/uL   Eosinophils Relative 4 0 - 5 %   Eosinophils Absolute 0.2 0.0 - 0.7 K/uL   Basophils Relative 1 0 - 1 %   Basophils Absolute 0.0 0.0 - 0.1 K/uL  Basic metabolic panel     Status: Abnormal   Collection Time: 05/20/15  4:30 PM  Result Value Ref Range   Sodium 135 135 - 145 mmol/L   Potassium 4.1 3.5 - 5.1 mmol/L   Chloride 104 101 - 111 mmol/L   CO2 23 22 - 32 mmol/L   Glucose, Bld 109 (H) 65 - 99 mg/dL   BUN 26 (H) 6 - 20 mg/dL   Creatinine, Ser 1.41 (H) 0.44 - 1.00 mg/dL   Calcium 9.4 8.9 - 10.3 mg/dL   GFR calc non Af Amer 30 (L) >60 mL/min   GFR calc Af Amer 35 (L) >60 mL/min    Comment: (NOTE) The eGFR has been calculated using the CKD EPI equation. This calculation has not been validated in all clinical situations. eGFR's persistently <60 mL/min signify possible Chronic Kidney Disease.    Anion gap 8 5 - 15  Type and screen for Red Blood Exchange     Status: None   Collection Time: 05/20/15   4:30 PM  Result Value Ref Range   ABO/RH(D) O POS    Antibody Screen NEG    Sample Expiration 05/23/2015     Dg Hip Unilat With Pelvis 2-3 Views Right  05/20/2015   CLINICAL DATA:  Acute right hip pain after fall yesterday. Initial encounter.  EXAM: DG HIP (WITH OR WITHOUT PELVIS) 2-3V RIGHT  COMPARISON:  None.  FINDINGS: Mildly displaced proximal neck fracture is seen involving the proximal right femur. Femoral head is situated within the acetabulum. This appears to be closed and posttraumatic.  IMPRESSION: Mildly displaced proximal right femoral neck fracture.   Electronically Signed   By: Marijo Conception, M.D.   On: 05/20/2015 15:49    Review of Systems  Constitutional: Positive for fever. Negative for chills.  HENT: Positive for hearing loss.   Cardiovascular: Negative for chest pain and palpitations.  Gastrointestinal: Negative for vomiting.  Musculoskeletal: Positive for joint pain.       Bilat knee OA with varus deformity  Neurological: Positive for weakness. Negative for dizziness, tingling and headaches.   Blood pressure 128/49, pulse 66, temperature 98.5 F (36.9 C), temperature source Oral, resp. rate 21, SpO2 96 %. Physical Exam  Constitutional: She appears well-developed and well-nourished.  HENT:  Head: Normocephalic and atraumatic.  Eyes: Pupils are equal, round, and reactive to light.  glasses  Neck: Normal range of motion. Neck supple. No thyromegaly present.  Cardiovascular: Normal rate.   Respiratory: Effort normal and breath sounds normal.  GI: Soft.  Musculoskeletal:  Pain with right hip ROM. Skin normal bilat varus knees with minimal effusion. Palp pulses  Skin: Skin is warm and dry. No rash noted.  Psychiatric: She has a normal mood and affect.    Assessment/Plan: inpacted  Right femoral neck fracture,. Discussed with pt and with daughter and SIL options. Recommend perc screw fixation which is the safest, quickest treatment. She will need SNF for a few  weeks but should be able to FWB again after 6 to 8 wks.  Risks of failure of fixation , reoperation, risks associated with her advanced age discussed. They request we proceed.  [name] 05/20/2015, 5:37 PM

## 2015-05-20 NOTE — Anesthesia Procedure Notes (Signed)
Procedure Name: LMA Insertion Date/Time: 05/20/2015 8:16 PM Performed by: Brien MatesMAHONY, Sayda Grable D Pre-anesthesia Checklist: Patient identified, Emergency Drugs available, Suction available, Patient being monitored and Timeout performed Patient Re-evaluated:Patient Re-evaluated prior to inductionOxygen Delivery Method: Circle system utilized Preoxygenation: Pre-oxygenation with 100% oxygen Intubation Type: IV induction Ventilation: Mask ventilation without difficulty LMA: LMA inserted LMA Size: 4.0 Number of attempts: 1 Placement Confirmation: positive ETCO2 and breath sounds checked- equal and bilateral Tube secured with: Tape Dental Injury: Teeth and Oropharynx as per pre-operative assessment

## 2015-05-20 NOTE — Transfer of Care (Signed)
Immediate Anesthesia Transfer of Care Note  Patient: Victoria Juarez  Procedure(s) Performed: Procedure(s): CANNULATED HIP PINNING (Right)  Patient Location: PACU  Anesthesia Type:General  Level of Consciousness: awake  Airway & Oxygen Therapy: Patient Spontanous Breathing and Patient connected to face mask oxygen  Post-op Assessment: Report given to RN and Post -op Vital signs reviewed and stable  Post vital signs: Reviewed and stable  Last Vitals:  Filed Vitals:   05/20/15 2049  BP: 151/62  Pulse:   Temp: 36.3 C  Resp:     Complications: No apparent anesthesia complications

## 2015-05-20 NOTE — Brief Op Note (Signed)
05/20/2015  8:46 PM  PATIENT:  Victoria Juarez  81 y.o. female  PRE-OPERATIVE DIAGNOSIS:  right impacted femoral neck fracture  POST-OPERATIVE DIAGNOSIS:  right impacted femoral neck fracture  PROCEDURE:  Procedure(s): CANNULATED HIP PINNING (Right)  SURGEON:  Surgeon(s) and Role:    * Eldred MangesMark C Jakyron Fabro, MD - Primary  PHYSICIAN ASSISTANT:   ASSISTANTS: none   ANESTHESIA:   local and general  EBL:  Total I/O In: 300 [I.V.:300] Out: -   BLOOD ADMINISTERED:none  DRAINS: none   LOCAL MEDICATIONS USED:  MARCAINE     SPECIMEN:  No Specimen  DISPOSITION OF SPECIMEN:  N/A  COUNTS:  YES  TOURNIQUET:  * No tourniquets in log *  DICTATION: .Other Dictation: Dictation Number [id number]  PLAN OF CARE: already admitted  PATIENT DISPOSITION:  PACU - hemodynamically stable.   Delay start of Pharmacological VTE agent (>24hrs) due to surgical blood loss or risk of bleeding: yes

## 2015-05-20 NOTE — ED Provider Notes (Signed)
CSN: [id number]     Arrival date & time 05/20/15  1337 History   First MD Initiated Contact with Patient 05/20/15 1532     Chief Complaint  Patient presents with  . Fall  . Hip Pain     (Consider location/radiation/quality/duration/timing/severity/associated sxs/prior Treatment) HPI Comments: Patient here after mechanical fall yesterday. No head injury. No chest or abdominal trauma. Complains of sharp pain in her right hip that is worse with walking or standing. She does use a walker normally and can still do this but with great discomfort. Has used Tylenol with limited relief. Denies numbness or tingling to her right foot. Symptoms are better with rest.  Patient is a 81 y.o. female presenting with fall and hip pain. The history is provided by the patient.  Fall  Hip Pain    Past Medical History  Diagnosis Date  . Hypertension   . Hearing loss     "both ears"  . Vision loss, bilateral   . High cholesterol   . Heart murmur   . Anginal pain   . Coronary artery disease   . GERD (gastroesophageal reflux disease)   . History of hiatal hernia   . Headache     "weekly" (12/26/2014)  . Arthritis     "all over" (12/26/2014)  . Chronic lower back pain   . Kidney stone     Patient has distention on the right side from scar tissue from a past embedded kidney stone.   . Trichiasis     "of lashes; gets this done q 3 months or so" (12/26/2014)  . HCAP (healthcare-associated pneumonia) 12/26/2014   Past Surgical History  Procedure Laterality Date  . Kidney stone surgery  1970's    "imbedded stone; had to cut her to get it out"  . Coronary angioplasty with stent placement  ~ 1998; ~ 2002    "1; 1"   . Dilation and curettage of uterus    . Entropian repair Right 01/01/2004    "lower eyelid"  . Ectropion repair Left 11/06/03    "lower eyelid"  . Entropian repair Bilateral     "lower"  . Punctoplasty Bilateral     "lower eyelids"  . Cataract extraction w/ intraocular lens implant  Bilateral 2000's  . Eye surgery Bilateral 2000's    "to prevent lashes growing inward; had upper and lower on one side; upper only on the other; has to have eyelashes plucked q 3 months or so" (12/26/2014(  . Eye surgery Bilateral     repair retractor   No family history on file. History  Substance Use Topics  . Smoking status: Never Smoker   . Smokeless tobacco: Never Used  . Alcohol Use: No   OB History    No data available     Review of Systems  All other systems reviewed and are negative.     Allergies  Codeine and Novocain  Home Medications   Prior to Admission medications   Medication Sig Start Date End Date Taking? Authorizing Provider  ALPRAZolam (XANAX) 0.25 MG tablet Take 1 tablet (0.25 mg total) by mouth daily as needed for sleep or anxiety. 02/21/13   Geoffry Paradise, MD  Artificial Tear Ointment (ARTIFICIAL TEARS) ointment Place 1 application into both eyes at bedtime.    Historical Provider, MD  aspirin EC 81 MG EC tablet Take 1 tablet (81 mg total) by mouth daily. 02/21/13   Geoffry Paradise, MD  calcitonin, salmon, (MIACALCIN/FORTICAL) 200 UNIT/ACT nasal spray Place 1 spray  into the nose daily. Patient not taking: Reported on 12/26/2014 02/21/13   Geoffry Paradiseichard Aronson, MD  carisoprodol (SOMA) 350 MG tablet Take 1 tablet (350 mg total) by mouth 3 (three) times daily as needed (spasm). Patient not taking: Reported on 12/26/2014 02/21/13   Geoffry Paradiseichard Aronson, MD  diltiazem [facility](TIAZAC) 300 MG 24 hr capsule Take 300 mg by mouth daily.    Historical Provider, MD  docusate sodium (COLACE) 100 MG capsule Take 100 mg by mouth daily.     Historical Provider, MD  escitalopram (LEXAPRO) 10 MG tablet Take 10 mg by mouth daily. 10/13/14   Historical Provider, MD  HORSE CHESTNUT PO Take 1 capsule by mouth at bedtime. For leg pain    Historical Provider, MD  isosorbide mononitrate (IMDUR) 30 MG 24 hr tablet Take 30 mg by mouth daily.    Historical Provider, MD  levofloxacin (LEVAQUIN) 250 MG  tablet Take 1 tablet (250 mg total) by mouth daily. 12/30/14   Geoffry Paradiseichard Aronson, MD  losartan (COZAAR) 50 MG tablet Take 50 mg by mouth daily. 09/22/14   Historical Provider, MD  omeprazole (PRILOSEC) 40 MG capsule Take 40 mg by mouth daily.    Historical Provider, MD  polyethylene glycol (MIRALAX / GLYCOLAX) packet Take 17 g by mouth daily as needed. 11/10/14   Joseph ArtJessica U Vann, DO  prednisoLONE acetate (PRED FORTE) 1 % ophthalmic suspension Place 1 drop into both eyes daily.    Historical Provider, MD  sucralfate (CARAFATE) 1 GM/10ML suspension Take 10 mLs (1 g total) by mouth 4 (four) times daily -  with meals and at bedtime. Patient not taking: Reported on 12/26/2014 11/10/14   Joseph ArtJessica U Vann, DO  traMADol (ULTRAM) 50 MG tablet Take 50 mg by mouth at bedtime.    Historical Provider, MD  triamterene-hydrochlorothiazide (MAXZIDE-25) 37.5-25 MG per tablet Take 1 tablet by mouth daily. 10/28/14   Historical Provider, MD   BP 117/47 mmHg  Pulse 72  Temp(Src) 98.5 F (36.9 C) (Oral)  Resp 18  SpO2 96% Physical Exam  Constitutional: She is oriented to person, place, and time. She appears well-developed and well-nourished.  Non-toxic appearance. No distress.  HENT:  Head: Normocephalic and atraumatic.  Eyes: Conjunctivae, EOM and lids are normal. Pupils are equal, round, and reactive to light.  Neck: Normal range of motion. Neck supple. No tracheal deviation present. No thyroid mass present.  Cardiovascular: Normal rate, regular rhythm and normal heart sounds.  Exam reveals no gallop.   No murmur heard. Pulmonary/Chest: Effort normal and breath sounds normal. No stridor. No respiratory distress. She has no decreased breath sounds. She has no wheezes. She has no rhonchi. She has no rales.  Abdominal: Soft. Normal appearance and bowel sounds are normal. She exhibits no distension. There is no tenderness. There is no rebound and no CVA tenderness.  Musculoskeletal: Normal range of motion. She exhibits no  edema or tenderness.  Right lower extremity shortened and rotated internally. Neurovascular intact  Neurological: She is alert and oriented to person, place, and time. She has normal strength. No cranial nerve deficit or sensory deficit. GCS eye subscore is 4. GCS verbal subscore is 5. GCS motor subscore is 6.  Skin: Skin is warm and dry. No abrasion and no rash noted.  Psychiatric: She has a normal mood and affect. Her speech is normal and behavior is normal.  Nursing note and vitals reviewed.   ED Course  Procedures (including critical care time) Labs Review Labs Reviewed  CBC WITH DIFFERENTIAL/PLATELET  BASIC METABOLIC PANEL    Imaging Review Dg Hip Unilat With Pelvis 2-3 Views Right  05/20/2015   CLINICAL DATA:  Acute right hip pain after fall yesterday. Initial encounter.  EXAM: DG HIP (WITH OR WITHOUT PELVIS) 2-3V RIGHT  COMPARISON:  None.  FINDINGS: Mildly displaced proximal neck fracture is seen involving the proximal right femur. Femoral head is situated within the acetabulum. This appears to be closed and posttraumatic.  IMPRESSION: Mildly displaced proximal right femoral neck fracture.   Electronically Signed   By: Lupita Raider, M.D.   On: 05/20/2015 15:49     EKG Interpretation None      MDM   Final diagnoses:  Fall   Patient has right hip fracture will consult orthopedics as well as hospitalist for admission    Lorre Nick, MD 05/20/15 1737

## 2015-05-20 NOTE — ED Notes (Signed)
Report given to OR RN. Consent sent with patient.

## 2015-05-21 ENCOUNTER — Encounter (HOSPITAL_COMMUNITY): Payer: Self-pay | Admitting: Orthopaedic Surgery

## 2015-05-21 ENCOUNTER — Inpatient Hospital Stay (HOSPITAL_COMMUNITY): Payer: Medicare Other

## 2015-05-21 DIAGNOSIS — M79662 Pain in left lower leg: Secondary | ICD-10-CM

## 2015-05-21 DIAGNOSIS — I10 Essential (primary) hypertension: Secondary | ICD-10-CM

## 2015-05-21 DIAGNOSIS — S72001D Fracture of unspecified part of neck of right femur, subsequent encounter for closed fracture with routine healing: Secondary | ICD-10-CM

## 2015-05-21 DIAGNOSIS — M79661 Pain in right lower leg: Secondary | ICD-10-CM

## 2015-05-21 DIAGNOSIS — I350 Nonrheumatic aortic (valve) stenosis: Secondary | ICD-10-CM

## 2015-05-21 LAB — BASIC METABOLIC PANEL
Anion gap: 6 (ref 5–15)
BUN: 18 mg/dL (ref 6–20)
CO2: 24 mmol/L (ref 22–32)
Calcium: 8.7 mg/dL — ABNORMAL LOW (ref 8.9–10.3)
Chloride: 103 mmol/L (ref 101–111)
Creatinine, Ser: 0.97 mg/dL (ref 0.44–1.00)
GFR calc Af Amer: 55 mL/min — ABNORMAL LOW (ref >60)
GFR, EST NON AFRICAN AMERICAN: 47 mL/min — AB (ref >60)
Glucose, Bld: 126 mg/dL — ABNORMAL HIGH (ref 65–99)
Potassium: 4.1 mmol/L (ref 3.5–5.1)
Sodium: 133 mmol/L — ABNORMAL LOW (ref 135–145)

## 2015-05-21 LAB — URINALYSIS, ROUTINE W REFLEX MICROSCOPIC
Bilirubin Urine: NEGATIVE
Glucose, UA: NEGATIVE mg/dL
Hgb urine dipstick: NEGATIVE
KETONES UR: NEGATIVE mg/dL
Leukocytes, UA: NEGATIVE
Nitrite: NEGATIVE
Protein, ur: NEGATIVE mg/dL
Specific Gravity, Urine: 1.013 (ref 1.005–1.030)
Urobilinogen, UA: 0.2 mg/dL (ref 0.0–1.0)
pH: 6 (ref 5.0–8.0)

## 2015-05-21 LAB — CBC
HEMATOCRIT: 28.7 % — AB (ref 36.0–46.0)
Hemoglobin: 9.1 g/dL — ABNORMAL LOW (ref 12.0–15.0)
MCH: 28.1 pg (ref 26.0–34.0)
MCHC: 31.7 g/dL (ref 30.0–36.0)
MCV: 88.6 fL (ref 78.0–100.0)
PLATELETS: 220 10*3/uL (ref 150–400)
RBC: 3.24 MIL/uL — ABNORMAL LOW (ref 3.87–5.11)
RDW: 15.1 % (ref 11.5–15.5)
WBC: 6.6 10*3/uL (ref 4.0–10.5)

## 2015-05-21 LAB — PROTIME-INR
INR: 1.11 (ref 0.00–1.49)
PROTHROMBIN TIME: 14.5 s (ref 11.6–15.2)

## 2015-05-21 LAB — ABO/RH: ABO/RH(D): O POS

## 2015-05-21 LAB — MRSA PCR SCREENING: MRSA by PCR: NEGATIVE

## 2015-05-21 NOTE — Evaluation (Signed)
Physical Therapy Evaluation Patient Details Name: Victoria Juarez MRN: [id number] DOB: 1915/09/01 Today's Date: 05/21/2015   History of Present Illness  81 yo admitted after fall with hip pain and mild displaced proximal right femoral neck fracture s/p pinning. PMHx: HTN, hearling loss, GERD  Clinical Impression  Pt very spunky and moving well for age and surgical repair. Pt very motivated to return to [facility] and discussed D/C disposition and need for additional assist with healing and on PWB restrictions as pt admits to not always using DME because she wants to carry things and has multiple falls. Pt will benefit from acute therapy to maximize strength, gait and transfers to decrease burden of care and fall risk.     Follow Up Recommendations SNF;Supervision for mobility/OOB    Equipment Recommendations  None recommended by PT    Recommendations for Other Services       Precautions / Restrictions Precautions Precautions: Fall Restrictions Weight Bearing Restrictions: Yes RLE Weight Bearing: Partial weight bearing RLE Partial Weight Bearing Percentage or Pounds: 50      Mobility  Bed Mobility Overal bed mobility: Modified Independent             General bed mobility comments: increased time  Transfers Overall transfer level: Needs assistance   Transfers: Sit to/from Stand Sit to Stand: Min assist         General transfer comment: cues for hand placement and sequence  Ambulation/Gait Ambulation/Gait assistance: Min assist;+2 safety/equipment Ambulation Distance (Feet): 15 Feet Assistive device: Rolling walker (2 wheeled) Gait Pattern/deviations: Step-through pattern;Decreased stride length;Trunk flexed   Gait velocity interpretation: Below normal speed for age/gender General Gait Details: cues for sequence, safety and posture with fatigue limiting distance with chair to follow  Stairs            Wheelchair Mobility    Modified Rankin (Stroke  Patients Only)       Balance Overall balance assessment: Needs assistance   Sitting balance-Leahy Scale: Fair       Standing balance-Leahy Scale: Poor                               Pertinent Vitals/Pain Pain Assessment: 0-10 Pain Score: 3  Pain Location: RLE  Pain Descriptors / Indicators: Aching Pain Intervention(s): Limited activity within patient's tolerance;Monitored during session;Repositioned    Home Living Family/patient expects to be discharged to:: Assisted living               Home Equipment: Walker - 4 wheels;Bedside commode;Shower seat Additional Comments: pt truly wants to return to [facility] . States one dgtr has MS and other dgtr is too busy to assist with care    Prior Function Level of Independence: Independent with assistive device(s)         Comments: pt ambulating with rollator to/from dining area . States she does her own bathing and dressing but staff performs cooking and cleaning. Pt does endorse mulitple falls even with utilization of DME     Hand Dominance        Extremity/Trunk Assessment   Upper Extremity Assessment: Generalized weakness           Lower Extremity Assessment: Generalized weakness      Cervical / Trunk Assessment: Kyphotic  Communication   Communication: HOH  Cognition Arousal/Alertness: Awake/alert Behavior During Therapy: WFL for tasks assessed/performed Overall Cognitive Status: Within Functional Limits for tasks assessed  General Comments      Exercises        Assessment/Plan    PT Assessment Patient needs continued PT services  PT Diagnosis Difficulty walking;Generalized weakness   PT Problem List Decreased strength;Decreased activity tolerance;Decreased balance;Decreased knowledge of precautions;Decreased safety awareness;Decreased knowledge of use of DME;Pain;Decreased mobility  PT Treatment Interventions Gait training;DME  instruction;Functional mobility training;Therapeutic activities;Therapeutic exercise;Patient/family education   PT Goals (Current goals can be found in the Care Plan section) Acute Rehab PT Goals Patient Stated Goal: return to [facility] PT Goal Formulation: With patient Time For Goal Achievement: 06/04/15 Potential to Achieve Goals: Good    Frequency Min 3X/week   Barriers to discharge Decreased caregiver support      Co-evaluation               End of Session Equipment Utilized During Treatment: Gait belt Activity Tolerance: Patient tolerated treatment well Patient left: in chair;with call bell/phone within reach Nurse Communication: Mobility status         Time: 1610-9604 PT Time Calculation (min) (ACUTE ONLY): 15 min   Charges:   PT Evaluation $Initial PT Evaluation Tier I: 1 Procedure     PT G CodesDelorse Lek 05/21/2015, 11:25 AM Delaney Meigs, PT [phone]

## 2015-05-21 NOTE — Evaluation (Signed)
Occupational Therapy Evaluation Patient Details Name: Victoria Juarez MRN: [id number] DOB: December 22, 1914 Today's Date: 05/21/2015    History of Present Illness 81 yo admitted after fall with hip pain and mild displaced proximal right femoral neck fracture s/p pinning. PMHx: HTN, hearling loss, GERD   Clinical Impression   Pt was performing mobility, self care and light meal prep in her ILF apartment at a modified independent level.  She presents with low vision due to macular degeneration, baseline tremor, generalized weakness and impaired balance interfering with ability to perform self care.  Will need post acute rehab prior to return home.  Will follow.    Follow Up Recommendations  SNF;Supervision/Assistance - 24 hour    Equipment Recommendations       Recommendations for Other Services       Precautions / Restrictions Precautions Precautions: Fall Restrictions Weight Bearing Restrictions: Yes RLE Weight Bearing: Partial weight bearing RLE Partial Weight Bearing Percentage or Pounds: 50      Mobility Bed Mobility                Transfers Overall transfer level: Needs assistance   Transfers: Stand Pivot Transfers Sit to Stand: Min assist Stand pivot transfers: Min assist       General transfer comment: cues for hand placement and sequence    Balance Overall balance assessment: Needs assistance   Sitting balance-Leahy Scale: Fair       Standing balance-Leahy Scale: Poor                              ADL Overall ADL's : Needs assistance/impaired Eating/Feeding: Independent;Sitting   Grooming: Wash/dry hands;Wash/dry face;Sitting;Set up   Upper Body Bathing: Minimal assitance;Sitting   Lower Body Bathing: Moderate assistance;Sit to/from stand   Upper Body Dressing : Set up;Sitting   Lower Body Dressing: Moderate assistance;Sit to/from stand Lower Body Dressing Details (indicate cue type and reason): difficulty accessing R LE for  bathing, dressing and nail care prior to admission, able to donn and doff L sock independently Toilet Transfer: Minimal assistance;Stand-pivot;BSC   Toileting- Clothing Manipulation and Hygiene: Maximal assistance;Sit to/from stand               Vision Additional Comments: pt is not able to read small print   Perception     Praxis      Pertinent Vitals/Pain Pain Assessment: Faces Pain Score: 3  Faces Pain Scale: Hurts a little bit Pain Location: R hip Pain Descriptors / Indicators: Operative site guarding Pain Intervention(s): Limited activity within patient's tolerance;Monitored during session;Repositioned;Ice applied     Hand Dominance Right   Extremity/Trunk Assessment Upper Extremity Assessment Upper Extremity Assessment: Generalized weakness (arthritic changes, mild tremor interferes with handwriting)   Lower Extremity Assessment Lower Extremity Assessment: Defer to PT evaluation   Cervical / Trunk Assessment Cervical / Trunk Assessment: Kyphotic   Communication Communication Communication: HOH   Cognition Arousal/Alertness: Awake/alert Behavior During Therapy: WFL for tasks assessed/performed Overall Cognitive Status: Within Functional Limits for tasks assessed                     General Comments       Exercises       Shoulder Instructions      Home Living Family/patient expects to be discharged to:: Private residence Living Arrangements: Alone Available Help at Discharge: Family;Available PRN/intermittently Type of Home: Apartment Home Access: Elevator     Home Layout: One level  Bathroom Shower/Tub: Other (comment) (walk in tub)   Bathroom Toilet: Standard     Home Equipment: Walker - 4 wheels;Bedside commode;Shower seat;Grab bars - toilet;Grab bars - tub/shower   Additional Comments: pt truly wants to return to [facility] . States one dgtr has MS and other dgtr is too busy to assist with care      Prior  Functioning/Environment Level of Independence: Independent with assistive device(s)        Comments: ambulated with rollator, made her own bfast and went to dining room for 2 other meals, modified independent in self care, daughter reports multiple falls    OT Diagnosis: Generalized weakness;Acute pain;Blindness and low vision   OT Problem List: Decreased strength;Decreased activity tolerance;Impaired balance (sitting and/or standing);Impaired vision/perception;Decreased coordination;Decreased knowledge of use of DME or AE;Impaired UE functional use;Pain   OT Treatment/Interventions: Self-care/ADL training;Therapeutic activities;Patient/family education;Balance training;DME and/or AE instruction    OT Goals(Current goals can be found in the care plan section) Acute Rehab OT Goals Patient Stated Goal: return to [facility] OT Goal Formulation: With patient Time For Goal Achievement: 06/04/15 Potential to Achieve Goals: Good ADL Goals Pt Will Perform Grooming: with supervision;standing Pt Will Perform Lower Body Dressing: with min assist;sit to/from stand Pt Will Transfer to Toilet: with supervision;ambulating;regular height toilet Pt Will Perform Toileting - Clothing Manipulation and hygiene: with supervision;sit to/from stand Additional ADL Goal #1: Pt will be aware of adaptation equipment and techniques for handwriting to compensate for tremor.  OT Frequency: Min 2X/week   Barriers to D/C:            Co-evaluation              End of Session Equipment Utilized During Treatment: Engineer, water Communication:  (pt urinated)  Activity Tolerance: Patient tolerated treatment well Patient left: in chair;with call bell/phone within reach;with nursing/sitter in room   Time: 1206-1234 OT Time Calculation (min): 28 min Charges:  OT General Charges $OT Visit: 1 Procedure OT Evaluation $Initial OT Evaluation Tier I: 1 Procedure OT Treatments $Self Care/Home  Management : 8-22 mins G-Codes:    Evern Bio 05/21/2015, 1:13 PM  [phone]

## 2015-05-21 NOTE — Progress Notes (Signed)
PROGRESS NOTE  Victoria Juarez ZOX:[id number] DOB: 02/09/1915 DOA: 05/20/2015 PCP: Minda Meo, MD  Brief history 81 year old female with a history of hypertension, hyperlipidemia, CAD, GERD presented after a mechanical fall while walking back to her kitchen on 05/19/2015. There was no syncope. The patient complained of right hip pain with any weightbearing. Workup revealed right femoral neck fracture. Orthopedics was consulted.  Dr. Ophelia Charter performed a right hip pin on 05/20/2015. Physical therapy was consulted and recommends skilled nursing facility. The patient and family are agreeable. The patient was initially transferred to stepdown unit secondary to her history of severe aortic stenosis. The patient has remained hemodynamically stable without any complications postoperatively.  Assessment/Plan: Right femoral neck fracture -Appreciate orthopedics -PT eval--->SNF -pain control -05/20/15--s/p R-hip pinning--Dr. Ophelia Charter Hypertension -Continue diltiazem and losartan -Blood pressure remains acceptable -Hold Maxide to avoid volume depletion in the setting of severe aortic stenosis Aortic stenosis -Remains hemodynamically stable -Avoid hypotension or volume depletion -Continue Imdur Coronary artery disease -Continue aspirin 81 mg daily -No chest pain presently -EKG without concerning ischemic changes  GERD  -Continue home dose of PPI  Anxiety  -Continue Lexapro   Family Communication:   Daughter updated at beside Disposition Plan:  SNF in 1-2 days        Procedures/Studies: Dg Chest Portable 1 View  05/20/2015   CLINICAL DATA:  Preop  EXAM: PORTABLE CHEST - 1 VIEW  COMPARISON:  12/26/2014  FINDINGS: Chronic interstitial markings. No focal consolidation. No pleural effusion or pneumothorax.  Cardiomegaly.  IMPRESSION: No evidence of acute cardiopulmonary disease.   Electronically Signed   By: Charline Bills M.D.   On: 05/20/2015 18:29   Dg Hip Operative Unilat  With Pelvis Right  05/20/2015   CLINICAL DATA:  Right hip pinning  EXAM: OPERATIVE right HIP (WITH PELVIS IF PERFORMED) 2 VIEWS  TECHNIQUE: Fluoroscopic spot image(s) were submitted for interpretation post-operatively.  COMPARISON:  05/20/2015 preoperative radiographs  FINDINGS: Three cannulated screws are observed, traversing the right femoral neck fracture. Near-anatomic alignment and positioning at the fracture site.  IMPRESSION: 1. Three cannulated screw fixation of the right femoral neck fracture, with near-anatomic alignment.   Electronically Signed   By: Gaylyn Rong M.D.   On: 05/20/2015 21:11   Dg Hip Unilat With Pelvis 2-3 Views Right  05/20/2015   CLINICAL DATA:  Acute right hip pain after fall yesterday. Initial encounter.  EXAM: DG HIP (WITH OR WITHOUT PELVIS) 2-3V RIGHT  COMPARISON:  None.  FINDINGS: Mildly displaced proximal neck fracture is seen involving the proximal right femur. Femoral head is situated within the acetabulum. This appears to be closed and posttraumatic.  IMPRESSION: Mildly displaced proximal right femoral neck fracture.   Electronically Signed   By: Lupita Raider, M.D.   On: 05/20/2015 15:49         Subjective: Patient denies fevers, chills, headache, chest pain, dyspnea, nausea, vomiting, diarrhea, abdominal pain, dysuria, hematuria   Objective: Filed Vitals:   05/21/15 0800 05/21/15 0818 05/21/15 0900 05/21/15 1000  BP: 128/48 128/48  124/46  Pulse:  64    Temp:  98.4 F (36.9 C)    TempSrc:  Oral    Resp: Height:      Weight:      SpO2: 100% 100% 97% 98%    Intake/Output Summary (Last 24 hours) at 05/21/15 1148 Last data filed at 05/21/15 1000  Gross per 24 hour  Intake  1135 ml  Output      0 ml  Net   1135 ml   Weight change:  Exam:   General:  Pt is alert, follows commands appropriately, not in acute distress  HEENT: No icterus, No thrush, No neck mass, [place]/AT  Cardiovascular: RRR, S1/S2, no rubs, no gallops;  2/6 systolic murmur LLS  Respiratory: CTA bilaterally, no wheezing, no crackles, no rhonchi  Abdomen: Soft/+BS, non tender, non distended, no guarding  Extremities: No edema, No lymphangitis, No petechiae, No rashes, no synovitis  Data Reviewed: Basic Metabolic Panel:  Recent Labs Lab 05/20/15 1630 05/21/15 0414  NA 135 133*  K 4.1 4.1  CL 104 103  CO2 23 24  GLUCOSE 109* 126*  BUN 26* 18  CREATININE 1.41* 0.97  CALCIUM 9.4 8.7*   Liver Function Tests: No results for input(s): AST, ALT, ALKPHOS, BILITOT, PROT, ALBUMIN in the last 168 hours. No results for input(s): LIPASE, AMYLASE in the last 168 hours. No results for input(s): AMMONIA in the last 168 hours. CBC:  Recent Labs Lab 05/20/15 1630 05/21/15 0414  WBC 5.8 6.6  NEUTROABS 3.5  --   HGB 9.6* 9.1*  HCT 29.7* 28.7*  MCV 86.8 88.6  PLT 241 220   Cardiac Enzymes: No results for input(s): CKTOTAL, CKMB, CKMBINDEX, TROPONINI in the last 168 hours. BNP: Invalid input(s): POCBNP CBG: No results for input(s): GLUCAP in the last 168 hours.  Recent Results (from the past 240 hour(s))  MRSA PCR Screening     Status: None   Collection Time: 05/20/15  9:57 PM  Result Value Ref Range Status   MRSA by PCR NEGATIVE NEGATIVE Final    Comment:        The GeneXpert MRSA Assay (FDA approved for NASAL specimens only), is one component of a comprehensive MRSA colonization surveillance program. It is not intended to diagnose MRSA infection nor to guide or monitor treatment for MRSA infections.      Scheduled Meds: . artificial tears   Both Eyes QID  . aspirin EC  81 mg Oral Daily  . calcitonin (salmon)  1 spray Alternating Nares Daily  . cholecalciferol  1,000 Units Oral Daily  . diltiazem  300 mg Oral Daily  . docusate sodium  100 mg Oral QHS  . enoxaparin (LOVENOX) injection  30 mg Subcutaneous Q24H  . escitalopram  10 mg Oral Daily  . isosorbide mononitrate  30 mg Oral Daily  . losartan  50 mg Oral Daily   . pantoprazole  80 mg Oral Daily  . prednisoLONE acetate  1 drop Both Eyes Daily  . traMADol  50 mg Oral QHS  . triamterene-hydrochlorothiazide  1 tablet Oral Daily   Continuous Infusions: . sodium chloride 20 mL/hr at 05/20/15 1646  . dextrose 5 % and 0.45% NaCl 75 mL/hr at 05/20/15 2228     Isra Lindy, DO  [facility] Pager [phone]  If 7PM-7AM, please contact night-coverage [email or web address] Password [email or web address] 05/21/2015, 11:48 AM   LOS: 1 day

## 2015-05-21 NOTE — Op Note (Signed)
[name]:  [name], [name]                 ACCOUNT NO.:  [id number]  MEDICAL RECORD NO.:  [id number]  LOCATION:  2H19C                        FACILITY:  [facility]  PHYSICIAN:  Donnette Macmullen C. Ophelia CharterYates, M.D.    DATE OF BIRTH:  10/21/1915  DATE OF PROCEDURE:  05/20/2015 DATE OF DISCHARGE:                              OPERATIVE REPORT   PREOPERATIVE DIAGNOSIS:  Impacted right femoral neck fracture.  POSTOPERATIVE DIAGNOSIS:  Impacted right femoral neck fracture.  PROCEDURE:  Right percutaneous screw fixation, right femoral neck 7.3 stainless steel cannulated screws x3.  SURGEON:  Renso Swett C. Ophelia CharterYates, MD  ANESTHESIA:  General.  ESTIMATED BLOOD LOSS:  Minimal.  INDICATIONS:  This 81 year old female with severe aortic stenosis, at risk for sudden death with anesthesia __________ impacted femoral neck fracture and was brought to the OR for percutaneous pinning of her hip before displaces.  Wrist surgery was discussed and that she was felt to be a better candidate for shoulder surgery with pinning of the hip versus total hip arthroplasty or hemiarthroplasty.  DESCRIPTION OF PROCEDURE:  After induction of anesthesia, the patient was placed on the fracture table, careful padding, positioning, internal rotation, no distraction.  C-arm was brought in, alignment looked good. The hip was prepped.  A large shower curtain, Steri-Drape was applied and under C-arm guidance, small incision was made in the lateral subtroch region.  Pin was placed up against the femur, checked, and then drilled up into the neck.  Additional pins were placed, when the triangle __________ as they went up the neck __________ angled.  On lateral film, they all were centered in the head in-line from lateral to medial.  Screws were measured.  Outer cortex was just reamed and the screws were inserted and tightened against the cortex, all looked good. Spinal spot pictures were taken.  Wound was irrigated, then skin closure with staples and small  dressing applied.  The patient tolerated the procedure well, had no hypotension and at the beginning of the case with prepping, I immediately did intra-articular lidocaine block into the hip capsule for some analgesia to allow them to lessen the amount of anesthetic with the potential for severe aortic stenosis, sudden death. The patient tolerated the procedure well, was transferred to recovery room in stable condition.    Charman Blasco C. Ophelia CharterYates, M.D.    MCY/MEDQ  D:  05/21/2015  T:  05/21/2015  Job:  [id number]

## 2015-05-21 NOTE — Care Management Note (Signed)
Case Management Note  Patient Details  Name: Charlyn MinervaRuby P Toral MRN: [id number] Date of Birth: February 22, 1915  Subjective/Objective:        Adm w hip fx            Action/Plan: from alf, pcp dr Quillian Quincer aronson   Expected Discharge Date:  05/22/15               Expected Discharge Plan:  Skilled Nursing Facility  In-House Referral:  Clinical Social Work  Discharge planning Services     Post Acute Care Choice:    Choice offered to:     DME Arranged:    DME Agency:     HH Arranged:    HH Agency:     Status of Service:     Medicare Important Message Given:    Date Medicare IM Given:    Medicare IM give by:    Date Additional Medicare IM Given:    Additional Medicare Important Message give by:     If discussed at Long Length of Stay Meetings, dates discussed:    Additional Comments: ur review done  Hanley HaysDowell, Dicie Edelen T, RN 05/21/2015, 10:43 AM

## 2015-05-21 NOTE — Progress Notes (Signed)
Orthopedic Tech Progress Note Patient Details:  Charlyn MinervaRuby P Horsley [birth year] [id number]  Patient ID: Charlyn Minervauby P Lamora, female   DOB: [birth year], 81 y.o.   MRN: [id number]   Shawnie PonsCammer, Kerianna Rawlinson Carol 05/21/2015, 9:04 AMUnable to use frame

## 2015-05-21 NOTE — Progress Notes (Signed)
Subjective: Right hip doing well.  C/o bilat calf pain.  No complaints of cp, sob.     Objective: Vital signs in last 24 hours: Temp:  [97.4 F (36.3 C)-98.5 F (36.9 C)] 98.4 F (36.9 C) (07/11 0818) Pulse Rate:  [64-72] 64 (07/11 0818) Resp:  [11-26] 22 (07/11 1200) BP: (87-156)/(38-62) 87/38 mmHg (07/11 1200) SpO2:  [94 %-100 %] 95 % (07/11 1200) Arterial Line BP: (140-177)/(40-64) 141/54 mmHg (07/11 0330) Weight:  [46.6 kg (102 lb 11.8 oz)] 46.6 kg (102 lb 11.8 oz) (07/10 2155)  Intake/Output from previous day: 07/10 0701 - 07/11 0700 In: 1040 [I.V.:1040] Out: -  Intake/Output this shift: Total I/O In: 115 [I.V.:115] Out: -    Recent Labs  05/20/15 1630 05/21/15 0414  HGB 9.6* 9.1*    Recent Labs  05/20/15 1630 05/21/15 0414  WBC 5.8 6.6  RBC 3.42* 3.24*  HCT 29.7* 28.7*  PLT 241 220    Recent Labs  05/20/15 1630 05/21/15 0414  NA 135 133*  K 4.1 4.1  CL 104 103  CO2 23 24  BUN 26* 18  CREATININE 1.41* 0.97  GLUCOSE 109* 126*  CALCIUM 9.4 8.7*    Recent Labs  05/21/15 0407  INR 1.11    Exam:  Very pleasant elderly WF alert and oriented, in NAD.  Right hip dressing c/d/i.  bilat calves mod tender.  NVI.    Assessment/Plan: Will get venous dopplers bilat le to r/o dvt.  Family members present in room.  Will likely need snf placement.     OWENS,JAMES M 05/21/2015, 1:21 PM

## 2015-05-21 NOTE — Progress Notes (Signed)
VASCULAR LAB PRELIMINARY  PRELIMINARY  PRELIMINARY  PRELIMINARY  Bilateral lower extremity venous Dopplers completed.    Preliminary report:  There is no DVT or SVT noted in the bilateral lower extremities.   Colin Ellers, RVT 05/21/2015, 6:27 PM

## 2015-05-21 NOTE — Progress Notes (Signed)
Report called to Carolyn, RN on 5N.  

## 2015-05-22 DIAGNOSIS — S72009A Fracture of unspecified part of neck of unspecified femur, initial encounter for closed fracture: Secondary | ICD-10-CM

## 2015-05-22 LAB — BASIC METABOLIC PANEL
Anion gap: 7 (ref 5–15)
BUN: 23 mg/dL — ABNORMAL HIGH (ref 6–20)
CHLORIDE: 103 mmol/L (ref 101–111)
CO2: 25 mmol/L (ref 22–32)
Calcium: 9.2 mg/dL (ref 8.9–10.3)
Creatinine, Ser: 1.1 mg/dL — ABNORMAL HIGH (ref 0.44–1.00)
GFR, EST AFRICAN AMERICAN: 47 mL/min — AB (ref >60)
GFR, EST NON AFRICAN AMERICAN: 41 mL/min — AB (ref >60)
Glucose, Bld: 92 mg/dL (ref 65–99)
Potassium: 4.1 mmol/L (ref 3.5–5.1)
SODIUM: 135 mmol/L (ref 135–145)

## 2015-05-22 LAB — CBC
HCT: 28.4 % — ABNORMAL LOW (ref 36.0–46.0)
HEMOGLOBIN: 9 g/dL — AB (ref 12.0–15.0)
MCH: 27.8 pg (ref 26.0–34.0)
MCHC: 31.7 g/dL (ref 30.0–36.0)
MCV: 87.7 fL (ref 78.0–100.0)
Platelets: 220 10*3/uL (ref 150–400)
RBC: 3.24 MIL/uL — AB (ref 3.87–5.11)
RDW: 15.1 % (ref 11.5–15.5)
WBC: 5.8 10*3/uL (ref 4.0–10.5)

## 2015-05-22 MED ORDER — TRAMADOL HCL 50 MG PO TABS
50.0000 mg | ORAL_TABLET | Freq: Four times a day (QID) | ORAL | Status: AC | PRN
Start: 2015-05-22 — End: ?

## 2015-05-22 MED ORDER — DILTIAZEM HCL ER COATED BEADS 120 MG PO CP24: 120.0000 mg | ORAL_CAPSULE | Freq: Every day | ORAL | Status: DC

## 2015-05-22 MED ORDER — ASPIRIN EC 81 MG PO TBEC: 81.0000 mg | DELAYED_RELEASE_TABLET | Freq: Every day | ORAL | Status: AC

## 2015-05-22 MED ORDER — DILTIAZEM HCL ER COATED BEADS 120 MG PO CP24
120.0000 mg | ORAL_CAPSULE | Freq: Every day | ORAL | Status: DC
  Filled 2015-05-22: qty 1

## 2015-05-22 NOTE — Discharge Instructions (Signed)
.  Ok to shower, but no tub soaking.  Do not apply any creams or ointments to incision.  Continue physical therapy protocol.  50% partial weightbearing right leg with a walker and assistance.

## 2015-05-22 NOTE — Discharge Summary (Signed)
Physician Discharge Summary  Victoria MinervaRuby P Juarez WUJ:[id number] DOB: Feb 03, 1915 DOA: 05/20/2015  PCP: Minda MeoARONSON,RICHARD A, MD  Admit date: 05/20/2015 Discharge date: 05/23/15 Recommendations for Outpatient Follow-up:  1. Pt will need to follow up with PCP in 2 weeks post discharge 2. Please obtain BMP and CBC in one week   Discharge Diagnoses:  Right femoral neck fracture -Appreciate orthopedics -PT eval--->SNF -pain control -05/20/15--s/p R-hip pinning--Dr. Ophelia CharterYates Hypertension -Continue losartan -Blood pressure remains acceptable -Hold Maxide to avoid volume depletion in the setting of severe aortic stenosis -d/c diltiazem as BP is 120s over 40-50 Aortic stenosis -Remains hemodynamically stable -Avoid hypotension or volume depletion -Continue Imdur Coronary artery disease -Continue aspirin 81 mg daily -No chest pain presently -EKG without concerning ischemic changes  GERD  -Continue home dose of PPI  Anxiety  -Continue Lexapro   Discharge Condition: stable  Disposition: SNF  Diet:regular Wt Readings from Last 3 Encounters:  05/20/15 46.6 kg (102 lb 11.8 oz)  12/28/14 44.5 kg (98 lb 1.7 oz)  11/10/14 44.478 kg (98 lb 0.9 oz)    History of present illness:  81 year old female with a history of hypertension, hyperlipidemia, CAD, GERD presented after a mechanical fall while walking back to her kitchen on 05/19/2015. There was no syncope. The patient complained of right hip pain with any weightbearing. Workup revealed right femoral neck fracture. Orthopedics was consulted. Dr. Ophelia CharterYates performed a right hip pin on 05/20/2015. Physical therapy was consulted and recommends skilled nursing facility. The patient and family are agreeable. The patient was initially transferred to stepdown unit secondary to her history of severe aortic stenosis. The patient has remained hemodynamically stable without any complications postoperatively.  Because the patient's systolic blood pressure remained  in the 120s and diastolic was 40-50, the patient's Maxide and diltiazem were discontinued. Her blood pressure was monitored and remained stable. She will continue on losartan and Imdur.    Consultants: Dr. Ophelia CharterYates  Discharge Exam: Filed Vitals:   05/22/15 0521  BP: 122/53  Pulse: 110  Temp: 98.2 F (36.8 C)  Resp: 16   Filed Vitals:   05/21/15 1430 05/21/15 1841 05/21/15 2103 05/22/15 0521  BP: 101/42 92/37 112/47 122/53  Pulse: 61 57 92 110  Temp: 97.8 F (36.6 C) 98.8 F (37.1 C) 99.3 F (37.4 C) 98.2 F (36.8 C)  TempSrc: Oral Oral    Resp: 20 18 18 16   Height:      Weight:      SpO2: 97% 95% 97% 94%   General: A&O x 3, NAD, pleasant, cooperative Cardiovascular: RRR, no rub, no gallop, no S3 Respiratory: CTAB, no wheeze, no rhonchi Abdomen:soft, nontender, nondistended, positive bowel sounds Extremities: No edema, No lymphangitis, no petechiae  Discharge Instructions     Medication List    STOP taking these medications        carisoprodol 350 MG tablet  Commonly known as:  SOMA     diltiazem 300 MG 24 hr capsule  Commonly known as:  TIAZAC     levofloxacin 250 MG tablet  Commonly known as:  LEVAQUIN     sucralfate 1 GM/10ML suspension  Commonly known as:  CARAFATE     triamterene-hydrochlorothiazide 37.5-25 MG per tablet  Commonly known as:  MAXZIDE-25      TAKE these medications        acetaminophen 325 MG tablet  Commonly known as:  TYLENOL  Take 650 mg by mouth 2 (two) times daily.     ALPRAZolam 0.25 MG tablet  Commonly  known as:  XANAX  Take 1 tablet (0.25 mg total) by mouth daily as needed for sleep or anxiety.     REFRESH P.M. OP  Apply 1 application to eye at bedtime.     artificial tears ointment  Place 1 application into both eyes 4 (four) times daily.     aspirin 81 MG EC tablet  Take 1 tablet (81 mg total) by mouth daily.     calcitonin (salmon) 200 UNIT/ACT nasal spray  Commonly known as:  MIACALCIN/FORTICAL  Place 1  spray into the nose daily.     Cholecalciferol 1000 UNITS tablet  Take 1,000 Units by mouth daily.     docusate sodium 100 MG capsule  Commonly known as:  COLACE  Take 100 mg by mouth at bedtime.     escitalopram 10 MG tablet  Commonly known as:  LEXAPRO  Take 10 mg by mouth daily.     HORSE CHESTNUT PO  Take 1 capsule by mouth at bedtime. For leg pain     isosorbide mononitrate 30 MG 24 hr tablet  Commonly known as:  IMDUR  Take 30 mg by mouth daily.     losartan 50 MG tablet  Commonly known as:  COZAAR  Take 50 mg by mouth daily.     omeprazole 40 MG capsule  Commonly known as:  PRILOSEC  Take 40 mg by mouth daily.     polyethylene glycol packet  Commonly known as:  MIRALAX / GLYCOLAX  Take 17 g by mouth daily as needed.     prednisoLONE acetate 1 % ophthalmic suspension  Commonly known as:  PRED FORTE  Place 1 drop into both eyes daily.     simethicone 125 MG chewable tablet  Commonly known as:  MYLICON  Chew 125 mg by mouth 2 (two) times daily.     traMADol 50 MG tablet  Commonly known as:  ULTRAM  Take 50 mg by mouth at bedtime.         The results of significant diagnostics from this hospitalization (including imaging, microbiology, ancillary and laboratory) are listed below for reference.    Significant Diagnostic Studies: Dg Chest Portable 1 View  05/20/2015   CLINICAL DATA:  Preop  EXAM: PORTABLE CHEST - 1 VIEW  COMPARISON:  12/26/2014  FINDINGS: Chronic interstitial markings. No focal consolidation. No pleural effusion or pneumothorax.  Cardiomegaly.  IMPRESSION: No evidence of acute cardiopulmonary disease.   Electronically Signed   By: Charline Bills M.D.   On: 05/20/2015 18:29   Dg Hip Operative Unilat With Pelvis Right  05/20/2015   CLINICAL DATA:  Right hip pinning  EXAM: OPERATIVE right HIP (WITH PELVIS IF PERFORMED) 2 VIEWS  TECHNIQUE: Fluoroscopic spot image(s) were submitted for interpretation post-operatively.  COMPARISON:  05/20/2015  preoperative radiographs  FINDINGS: Three cannulated screws are observed, traversing the right femoral neck fracture. Near-anatomic alignment and positioning at the fracture site.  IMPRESSION: 1. Three cannulated screw fixation of the right femoral neck fracture, with near-anatomic alignment.   Electronically Signed   By: Gaylyn Rong M.D.   On: 05/20/2015 21:11   Dg Hip Unilat With Pelvis 2-3 Views Right  05/20/2015   CLINICAL DATA:  Acute right hip pain after fall yesterday. Initial encounter.  EXAM: DG HIP (WITH OR WITHOUT PELVIS) 2-3V RIGHT  COMPARISON:  None.  FINDINGS: Mildly displaced proximal neck fracture is seen involving the proximal right femur. Femoral head is situated within the acetabulum. This appears to be closed and posttraumatic.  IMPRESSION: Mildly displaced proximal right femoral neck fracture.   Electronically Signed   By: Lupita Raider, M.D.   On: 05/20/2015 15:49     Microbiology: Recent Results (from the past 240 hour(s))  MRSA PCR Screening     Status: None   Collection Time: 05/20/15  9:57 PM  Result Value Ref Range Status   MRSA by PCR NEGATIVE NEGATIVE Final    Comment:        The GeneXpert MRSA Assay (FDA approved for NASAL specimens only), is one component of a comprehensive MRSA colonization surveillance program. It is not intended to diagnose MRSA infection nor to guide or monitor treatment for MRSA infections.      Labs: Basic Metabolic Panel:  Recent Labs Lab 05/20/15 1630 05/21/15 0414 05/22/15 0408  NA 135 133* 135  K 4.1 4.1 4.1  CL 104 103 103  CO2 GLUCOSE 109* 126* 92  BUN 26* 18 23*  CREATININE 1.41* 0.97 1.10*  CALCIUM 9.4 8.7* 9.2   Liver Function Tests: No results for input(s): AST, ALT, ALKPHOS, BILITOT, PROT, ALBUMIN in the last 168 hours. No results for input(s): LIPASE, AMYLASE in the last 168 hours. No results for input(s): AMMONIA in the last 168 hours. CBC:  Recent Labs Lab 05/20/15 1630  05/21/15 0414 05/22/15 0408  WBC 5.8 6.6 5.8  NEUTROABS 3.5  --   --   HGB 9.6* 9.1* 9.0*  HCT 29.7* 28.7* 28.4*  MCV 86.8 88.6 87.7  PLT 241 220 220   Cardiac Enzymes: No results for input(s): CKTOTAL, CKMB, CKMBINDEX, TROPONINI in the last 168 hours. BNP: Invalid input(s): POCBNP CBG: No results for input(s): GLUCAP in the last 168 hours.  Time coordinating discharge:  Greater than 30 minutes  Signed:  Ramonia Mcclaran, DO [facility] Pager: [phone] 05/22/2015, 11:36 AM

## 2015-05-22 NOTE — Progress Notes (Signed)
Subjective: Doing well.  Venous doppler negative.    Objective: Vital signs in last 24 hours: Temp:  [97.8 F (36.6 C)-99.3 F (37.4 C)] 98.2 F (36.8 C) (07/12 0521) Pulse Rate:  [57-110] 110 (07/12 0521) Resp:  [16-25] 16 (07/12 0521) BP: (87-128)/(37-53) 122/53 mmHg (07/12 0521) SpO2:  [94 %-100 %] 94 % (07/12 0521)  Intake/Output from previous day: 07/11 0701 - 07/12 0700 In: 375 [P.O.:240; I.V.:135] Out: -  Intake/Output this shift:     Recent Labs  05/20/15 1630 05/21/15 0414 05/22/15 0408  HGB 9.6* 9.1* 9.0*    Recent Labs  05/21/15 0414 05/22/15 0408  WBC 6.6 5.8  RBC 3.24* 3.24*  HCT 28.7* 28.4*  PLT 220 220    Recent Labs  05/21/15 0414 05/22/15 0408  NA 133* 135  K 4.1 4.1  CL 103 103  CO2 24 25  BUN 18 23*  CREATININE 0.97 1.10*  GLUCOSE 126* 92  CALCIUM 8.7* 9.2    Recent Labs  05/21/15 0407  INR 1.11    Exam:  Alert and oriented.  NVI.    Assessment/Plan: Needs snf placement for rehab.  Transfer when bed available.    OWENS,JAMES M 05/22/2015, 7:38 AM

## 2015-05-22 NOTE — Care Management Important Message (Signed)
Important Message  Patient Details  Name: Victoria Juarez MRN: [id number] Date of Birth: 27-Nov-1914   Medicare Important Message Given:  Yes-second notification given    Orson AloeMegan P Fausto Sampedro 05/22/2015, 1:07 PM

## 2015-05-23 DIAGNOSIS — I350 Nonrheumatic aortic (valve) stenosis: Secondary | ICD-10-CM

## 2015-05-23 DIAGNOSIS — I10 Essential (primary) hypertension: Secondary | ICD-10-CM

## 2015-05-23 LAB — BASIC METABOLIC PANEL
Anion gap: 8 (ref 5–15)
BUN: 24 mg/dL — ABNORMAL HIGH (ref 6–20)
CALCIUM: 9.1 mg/dL (ref 8.9–10.3)
CO2: 26 mmol/L (ref 22–32)
CREATININE: 1.09 mg/dL — AB (ref 0.44–1.00)
Chloride: 102 mmol/L (ref 101–111)
GFR calc Af Amer: 48 mL/min — ABNORMAL LOW (ref >60)
GFR calc non Af Amer: 41 mL/min — ABNORMAL LOW (ref >60)
Glucose, Bld: 92 mg/dL (ref 65–99)
Potassium: 4.3 mmol/L (ref 3.5–5.1)
Sodium: 136 mmol/L (ref 135–145)

## 2015-05-23 LAB — CBC
HEMATOCRIT: 28.6 % — AB (ref 36.0–46.0)
HEMOGLOBIN: 9 g/dL — AB (ref 12.0–15.0)
MCH: 27.4 pg (ref 26.0–34.0)
MCHC: 31.5 g/dL (ref 30.0–36.0)
MCV: 86.9 fL (ref 78.0–100.0)
Platelets: 247 10*3/uL (ref 150–400)
RBC: 3.29 MIL/uL — ABNORMAL LOW (ref 3.87–5.11)
RDW: 15 % (ref 11.5–15.5)
WBC: 5.9 10*3/uL (ref 4.0–10.5)

## 2015-05-23 NOTE — Clinical Social Work Placement (Signed)
   CLINICAL SOCIAL WORK PLACEMENT  NOTE  Date:  05/23/2015  Patient Details  Name: Victoria Juarez MRN: [id number] Date of Birth: 1915-01-05  Clinical Social Work is seeking post-discharge placement for this patient at the Skilled  Nursing Facility level of care (*CSW will initial, date and re-position this form in  chart as items are completed):  Yes   Patient/family provided with [place] Clinical Social Work Department's list of facilities offering this level of care within the geographic area requested by the patient (or if unable, by the patient's family).  Yes   Patient/family informed of their freedom to choose among providers that offer the needed level of care, that participate in Medicare, Medicaid or managed care program needed by the patient, have an available bed and are willing to accept the patient.  Yes   Patient/family informed of [facility]'s ownership interest in [facility] and [facility], as well as of the fact that they are under no obligation to receive care at these facilities.  PASRR submitted to EDS on  (n/a)     PASRR number received on  (n/a)     Existing PASRR number confirmed on 05/22/15     FL2 transmitted to all facilities in geographic area requested by pt/family on 05/22/15     FL2 transmitted to all facilities within larger geographic area on  (n/a)     Patient informed that his/her managed care company has contracts with or will negotiate with certain facilities, including the following:   (yes, [facility])     Yes   Patient/family informed of bed offers received.  [facility]     Physician recommends and patient chooses bed at  (n/a)    Patient to be transferred to [facility] on 05/23/15.  Patient to be transferred to facility by [facility]AR     Patient family notified on 05/23/15 of transfer.  Name of family member notified:  Patient updated at bedside.     PHYSICIAN    Additional Comment:    _______________________________________________ Rod MaeVaughn, Alyan Hartline S, LCSW 05/23/2015, 10:47 AM [phone]

## 2015-05-23 NOTE — Clinical Social Work Placement (Signed)
   CLINICAL SOCIAL WORK PLACEMENT  NOTE  Date:  05/23/2015  Patient Details  Name: Charlyn MinervaRuby P Misuraca MRN: [id number] Date of Birth: [birth year]  Clinical Social Work is seeking post-discharge placement for this patient at the Skilled  Nursing Facility level of care (*CSW will initial, date and re-position this form in  chart as items are completed):  Yes   Patient/family provided with [place] Clinical Social Work Department's list of facilities offering this level of care within the geographic area requested by the patient (or if unable, by the patient's family).  Yes   Patient/family informed of their freedom to choose among providers that offer the needed level of care, that participate in Medicare, Medicaid or managed care program needed by the patient, have an available bed and are willing to accept the patient.  Yes   Patient/family informed of [facility]'s ownership interest in [facility] and [facility], as well as of the fact that they are under no obligation to receive care at these facilities.  PASRR submitted to EDS on  (n/a)     PASRR number received on  (n/a)     Existing PASRR number confirmed on 05/22/15     FL2 transmitted to all facilities in geographic area requested by pt/family on 05/22/15     FL2 transmitted to all facilities within larger geographic area on  (n/a)     Patient informed that his/her managed care company has contracts with or will negotiate with certain facilities, including the following:   (yes, [facility])     Yes   Patient/family informed of bed offers received.  Patient chooses bed at       Physician recommends and patient chooses bed at      Patient to be transferred to   on  .  Patient to be transferred to facility by       Patient family notified on   of transfer.  Name of family member notified:        PHYSICIAN Please sign DNR, Please sign FL2     Additional Comment:     _______________________________________________ Rod MaeVaughn, Violeta Lecount S, LCSW 05/23/2015, 8:51 AM [phone]

## 2015-05-23 NOTE — Progress Notes (Signed)
Physical Therapy Treatment Patient Details Name: Charlyn MinervaRuby P Stabler MRN: [id number] DOB: 1915/03/13 Today's Date: 05/23/2015    History of Present Illness 81 yo admitted after fall with hip pain and mild displaced proximal right femoral neck fracture s/p pinning. PMHx: HTN, hearling loss, GERD    PT Comments    Pt is doing well for her age and medical situation. Increased walking to 50'. Used a lot of body language to community 2/2 hearing deficit.  Follow Up Recommendations  SNF;Supervision for mobility/OOB     Equipment Recommendations  None recommended by PT    Recommendations for Other Services       Precautions / Restrictions Precautions Precautions: Fall Restrictions RLE Weight Bearing: Partial weight bearing RLE Partial Weight Bearing Percentage or Pounds: 50    Mobility  Bed Mobility               General bed mobility comments: Pt found in chair.  Transfers Overall transfer level: Needs assistance Equipment used: Rolling walker (2 wheeled) Transfers: Sit to/from Stand Sit to Stand: Min assist         General transfer comment: Cues for hand placement and sequence.  Ambulation/Gait Ambulation/Gait assistance: Min assist Ambulation Distance (Feet): 50 Feet Assistive device: Rolling walker (2 wheeled) Gait Pattern/deviations: Step-through pattern;Decreased stride length   Gait velocity interpretation: at or above normal speed for age/gender General Gait Details: Min A to ensure balacne patient was safe with RW use. Recommend youth RW next session.   Stairs            Wheelchair Mobility    Modified Rankin (Stroke Patients Only)       Balance                                    Cognition Arousal/Alertness: Awake/alert Behavior During Therapy: WFL for tasks assessed/performed Overall Cognitive Status: Within Functional Limits for tasks assessed                      Exercises Total Joint Exercises Quad Sets:  AAROM;AROM;10 reps;Both;Seated Heel Slides: AROM;10 reps;Both;Seated Hip ABduction/ADduction: AROM;10 reps;Both;Seated Straight Leg Raises: AROM;10 reps;Both;Seated Long Arc Quad: Strengthening;10 reps;Seated;Right    General Comments        Pertinent Vitals/Pain Pain Assessment: 0-10 Pain Score: 4  Pain Location: R hip 4/10. L hip 6/10. Pain Descriptors / Indicators: Sore Pain Intervention(s): Monitored during session    Home Living                      Prior Function            PT Goals (current goals can now be found in the care plan section) Progress towards PT goals: Progressing toward goals    Frequency  Min 3X/week    PT Plan Current plan remains appropriate    Co-evaluation             End of Session Equipment Utilized During Treatment: Gait belt Activity Tolerance: Patient tolerated treatment well Patient left: in chair;with family/visitor present;with call bell/phone within reach     Time: [id number] PT Time Calculation (min) (ACUTE ONLY): 17 min  Charges:                       G CodesNita Sells:      Omolola Mittman, SPTA 05/23/2015, 10:28 AM

## 2015-05-23 NOTE — Clinical Social Work Note (Signed)
Clinical Social Work Assessment  Patient Details  Name: Victoria Juarez MRN: [id number] Date of Birth: 10/11/1915  Date of referral:  05/23/15               Reason for consult:  Facility Placement, Discharge Planning                Permission sought to share information with:  Family Supports, Customer service manager Permission granted to share information::  Yes, Verbal Permission Granted  Name::     [place]::  [facility]  Relationship::  Daughter  Contact Information:  [phone]  Housing/Transportation Living arrangements for the past 2 months:  [facility]) Source of Information:  Patient Patient Interpreter Needed:  None Criminal Activity/Legal Involvement Pertinent to Current Situation/Hospitalization:  No - Comment as needed Significant Relationships:  Adult Children Lives with:  Facility Resident Do you feel safe going back to the place where you live?  No (High fall risk at this time. Plans to return to [facility] Need for family participation in patient care:  Yes (Comment) (Patient's daughter active in patient's care.)  Care giving concerns:  Patient expressed no concerns at this time.   Social Worker assessment / plan:  CSW received referral for possible SNF placement at time of discharge. CSW met with patient at bedside to discuss discharge disposition. Per patient, patient has been living in [place] at [facility] prior to admission to [facility]. Patient reports patient has previously completed short-term rehabilitation at [facility], but patient's daughter was currently exploring all SNF options. Patient granted CSW permission to speak with patient's daughter regarding discharge disposition. CSW and patient's daughter have both attempted to reach one another via phone. CSW currently awaiting return call from patient's daughter to confirm SNF bed choice. CSW to  continue to follow and assist with discharge planning needs.  Employment status:  Retired Forensic scientist:  Medicare PT Recommendations:  [place] / Referral to community resources:  [place]  Patient/Family's Response to care:  Patient understanding and agreeable to CSW plan of care.  Patient/Family's Understanding of and Emotional Response to Diagnosis, Current Treatment, and Prognosis:  Patient understanding and agreeable to CSW plan of care.  Emotional Assessment Appearance:  Appears stated age Attitude/Demeanor/Rapport:  Other (Pleasant.) Affect (typically observed):  Accepting, Appropriate, Pleasant Orientation:  Oriented to Self, Oriented to Place, Oriented to  Time, Oriented to Situation Alcohol / Substance use:  Not Applicable Psych involvement (Current and /or in the community):  No (Comment) (Not appropriate on this admission.)  Discharge Needs  Concerns to be addressed:  No discharge needs identified Readmission within the last 30 days:  No Current discharge risk:  None Barriers to Discharge:  No Barriers Identified   Caroline Sauger, LCSW 05/23/2015, 8:48 AM [phone]

## 2015-05-23 NOTE — Progress Notes (Signed)
Orthopedic Tech Progress Note Patient Details:  Victoria Juarez September 08, 1915 [id number]  Patient ID: Victoria Juarez, female   DOB: September 08, 1915, 81 y.o.   MRN: [id number] Pt unable to use trapeze bar patient helper   Victoria Juarez, Victoria Juarez 05/23/2015, 9:51 AM

## 2015-05-23 NOTE — Progress Notes (Signed)
Orthopedic Tech Progress Note Patient Details:  Victoria Juarez 04-Apr-1915 [id number]  Patient ID: Victoria Juarez, female   DOB: 04-Apr-1915, 81 y.o.   MRN: [id number] Pt unable to use trapeze bar patient helper  Nikki DomCrawford, Tedric Leeth 05/23/2015, 12:41 PM

## 2015-05-23 NOTE — Progress Notes (Signed)
Subjective: 3 Days Post-Op Procedure(s) (LRB): CANNULATED HIP PINNING (Right) Patient reports pain as mild.    Objective: Vital signs in last 24 hours: Temp:  [98.1 F (36.7 C)-99.4 F (37.4 C)] 99.4 F (37.4 C) (07/13 0520) Pulse Rate:  [73-80] 73 (07/13 0520) Resp:  [16-18] 17 (07/13 0520) BP: (86-128)/(49-56) 128/56 mmHg (07/13 0520) SpO2:  [97 %-98 %] 97 % (07/13 0520)  Intake/Output from previous day: 07/12 0701 - 07/13 0700 In: 500 [P.O.:500] Out: -  Intake/Output this shift:     Recent Labs  05/20/15 1630 05/21/15 0414 05/22/15 0408 05/23/15 0412  HGB 9.6* 9.1* 9.0* 9.0*    Recent Labs  05/22/15 0408 05/23/15 0412  WBC 5.8 5.9  RBC 3.24* 3.29*  HCT 28.4* 28.6*  PLT 220 247    Recent Labs  05/22/15 0408 05/23/15 0412  NA 135 136  K 4.1 4.3  CL 103 102  CO2 25 26  BUN 23* 24*  CREATININE 1.10* 1.09*  GLUCOSE 92 92  CALCIUM 9.2 9.1    Recent Labs  05/21/15 0407  INR 1.11    Neurologically intact  Assessment/Plan: 3 Days Post-Op Procedure(s) (LRB): CANNULATED HIP PINNING (Right) Discharge to SNF   Still no FL2 on chart. Ordered this weekend. Requested last 2 days.   Sadiq Mccauley C 05/23/2015, 7:43 AM

## 2015-05-23 NOTE — Progress Notes (Signed)
[facility] PROGRESS NOTE  RASHIKA BETTES RUE:[id number] DOB: Nov 26, 1914 DOA: 05/20/2015  PCP: Minda Meo, MD  Brief HPI: 81 year old Caucasian female presented after a mechanical fall resulting in right hip pain. She was found to have right femoral neck fracture. She underwent surgery.  Past medical history:  Past Medical History  Diagnosis Date  . Hypertension   . Hearing loss     "both ears"  . Vision loss, bilateral   . High cholesterol   . Heart murmur   . Anginal pain   . Coronary artery disease   . GERD (gastroesophageal reflux disease)   . History of hiatal hernia   . Headache     "weekly" (12/26/2014)  . Arthritis     "all over" (12/26/2014)  . Chronic lower back pain   . Kidney stone     Patient has distention on the right side from scar tissue from a past embedded kidney stone.   . Trichiasis     "of lashes; gets this done q 3 months or so" (12/26/2014)  . HCAP (healthcare-associated pneumonia) 12/26/2014    Consultants: Orthopedics  Procedures: CANNULATED HIP PINNING (Right)   Subjective: Patient states that her hip feels better. No pain. Continues to have some pain in the left rib cage area and back area from her fall.  Objective: Vital Signs  Filed Vitals:   05/22/15 1700 05/22/15 2051 05/23/15 0116 05/23/15 0520  BP: 100/49 112/49  128/56  Pulse: 78 79 78 73  Temp:  99.2 F (37.3 C)  99.4 F (37.4 C)  TempSrc:    Oral  Resp:  16  17  Height:      Weight:      SpO2:  98% 98% 97%   No intake or output data in the 24 hours ending 05/23/15 1412 Filed Weights   05/20/15 2155  Weight: 46.6 kg (102 lb 11.8 oz)    General appearance: alert, cooperative, appears stated age and no distress Resp: clear to auscultation bilaterally Cardio: regular rate and rhythm, S1, S2 normal, no murmur, click, rub or gallop GI: soft, non-tender; bowel sounds normal; no masses,  no organomegaly Extremities: extremities normal, atraumatic, no cyanosis  or edema  Lab Results:  Basic Metabolic Panel:  Recent Labs Lab 05/20/15 1630 05/21/15 0414 05/22/15 0408 05/23/15 0412  NA 135 133* 135 136  K 4.1 4.1 4.1 4.3  CL 104 103 103 102  CO2 GLUCOSE 109* 126* 92 92  BUN 26* 18 23* 24*  CREATININE 1.41* 0.97 1.10* 1.09*  CALCIUM 9.4 8.7* 9.2 9.1   CBC:  Recent Labs Lab 05/20/15 1630 05/21/15 0414 05/22/15 0408 05/23/15 0412  WBC 5.8 6.6 5.8 5.9  NEUTROABS 3.5  --   --   --   HGB 9.6* 9.1* 9.0* 9.0*  HCT 29.7* 28.7* 28.4* 28.6*  MCV 86.8 88.6 87.7 86.9  PLT 241 220 220 247     Recent Results (from the past 240 hour(s))  MRSA PCR Screening     Status: None   Collection Time: 05/20/15  9:57 PM  Result Value Ref Range Status   MRSA by PCR NEGATIVE NEGATIVE Final    Comment:        The GeneXpert MRSA Assay (FDA approved for NASAL specimens only), is one component of a comprehensive MRSA colonization surveillance program. It is not intended to diagnose MRSA infection nor to guide or monitor treatment for MRSA infections.  Studies/Results: No results found.  Medications:  Scheduled: . artificial tears   Both Eyes QID  . aspirin EC  81 mg Oral Daily  . calcitonin (salmon)  1 spray Alternating Nares Daily  . cholecalciferol  1,000 Units Oral Daily  . docusate sodium  100 mg Oral QHS  . enoxaparin (LOVENOX) injection  30 mg Subcutaneous Q24H  . escitalopram  10 mg Oral Daily  . isosorbide mononitrate  30 mg Oral Daily  . losartan  50 mg Oral Daily  . pantoprazole  80 mg Oral Daily  . prednisoLONE acetate  1 drop Both Eyes Daily  . traMADol  50 mg Oral QHS   Continuous: . sodium chloride 20 mL/hr at 05/20/15 1646  . dextrose 5 % and 0.45% NaCl 75 mL/hr at 05/20/15 2228   ZOX:[id number]:acetaminophen **OR** acetaminophen, ALPRAZolam, bisacodyl, HYDROcodone-acetaminophen, menthol-cetylpyridinium **OR** phenol, metoCLOPramide **OR** metoCLOPramide (REGLAN) injection, morphine injection, ondansetron  **OR** ondansetron (ZOFRAN) IV, polyethylene glycol, simethicone  Assessment/Plan:  Active Problems:   Fracture of femoral neck, right   Femoral neck fracture   Essential hypertension   Aortic stenosis, severe   Hip fracture requiring operative repair    Right femoral neck fracture Patient underwent a right hip pinning on July 10. Pain is under reasonably good control. She was seen by physical therapy. Skilled nursing facility has been recommended.  History of essential Hypertension Blood pressure is reasonably well controlled. Continue medications as outlined. See discharge summary.  Aortic stenosis Remains hemodynamically stable. Avoid hypotension or volume depletion. Continue Imdur  Coronary artery disease Continue aspirin 81 mg daily. Stable  GERD  Continue home dose of PPI   Anxiety  Continue Lexapro   DVT Prophylaxis: Lovenox    Code Status: DO NOT RESUSCITATE  Family Communication: No family at bedside. Discussed with the patient  Disposition Plan: Plan is for discharge to SNF today.     LOS: 3 days   [facility] County Public HospitalKRISHNAN,Amit Leece  [facility] Pager [phone] 05/23/2015, 2:12 PM  If 7PM-7AM, please contact night-coverage at [email or web address], password [email or web address]

## 2015-05-23 NOTE — Discharge Planning (Signed)
[facility]. Patient and patient's daughter updated.  [facility] report number: [phone] Transportation: EMS [facility]) scheduled for 1:30pm.  Lily Kochermily Reco Shonk, LCSWA - [phone]- [phone] Clinical Social Work Department Orthopedics [phone]([id number]) and Surgical [phone]([id number])

## 2015-05-23 NOTE — Progress Notes (Signed)
Pt to be d/c per MD. [facility]-reports she is waiting on a summary. Peripheral IV removed, belongings gathered. Waiting on transport scheduled for 1:30 pm  Lowella DellHudson, Anjanae Woehrle G 05/23/2015 12:19 PM

## 2015-05-25 ENCOUNTER — Encounter (HOSPITAL_COMMUNITY): Payer: Self-pay | Admitting: Orthopaedic Surgery

## 2016-01-17 ENCOUNTER — Emergency Department (HOSPITAL_COMMUNITY)
Admission: EM | Admit: 2016-01-17 | Discharge: 2016-01-17 | Disposition: A | Discharge status: Home / Self Care | Payer: Medicare Other | Attending: Emergency Medicine | Admitting: Emergency Medicine

## 2016-01-17 ENCOUNTER — Emergency Department (HOSPITAL_COMMUNITY): Payer: Medicare Other

## 2016-01-17 ENCOUNTER — Encounter (HOSPITAL_COMMUNITY): Payer: Self-pay

## 2016-01-17 DIAGNOSIS — R079 Chest pain, unspecified: Secondary | ICD-10-CM | POA: Diagnosis not present

## 2016-01-17 DIAGNOSIS — I10 Essential (primary) hypertension: Secondary | ICD-10-CM | POA: Diagnosis not present

## 2016-01-17 DIAGNOSIS — R1310 Dysphagia, unspecified: Secondary | ICD-10-CM | POA: Diagnosis not present

## 2016-01-17 DIAGNOSIS — H9190 Unspecified hearing loss, unspecified ear: Secondary | ICD-10-CM | POA: Diagnosis not present

## 2016-01-17 DIAGNOSIS — I25119 Atherosclerotic heart disease of native coronary artery with unspecified angina pectoris: Secondary | ICD-10-CM | POA: Insufficient documentation

## 2016-01-17 DIAGNOSIS — H543 Unqualified visual loss, both eyes: Secondary | ICD-10-CM | POA: Diagnosis not present

## 2016-01-17 DIAGNOSIS — G8929 Other chronic pain: Secondary | ICD-10-CM | POA: Insufficient documentation

## 2016-01-17 LAB — BASIC METABOLIC PANEL
Anion gap: 10 (ref 5–15)
BUN: 23 mg/dL — ABNORMAL HIGH (ref 6–20)
CALCIUM: 9.7 mg/dL (ref 8.9–10.3)
CO2: 24 mmol/L (ref 22–32)
CREATININE: 1.21 mg/dL — AB (ref 0.44–1.00)
Chloride: 107 mmol/L (ref 101–111)
GFR calc Af Amer: 42 mL/min — ABNORMAL LOW (ref >60)
GFR calc non Af Amer: 36 mL/min — ABNORMAL LOW (ref >60)
Glucose, Bld: 135 mg/dL — ABNORMAL HIGH (ref 65–99)
Potassium: 4.5 mmol/L (ref 3.5–5.1)
SODIUM: 141 mmol/L (ref 135–145)

## 2016-01-17 LAB — CBC
HCT: 27.9 % — ABNORMAL LOW (ref 36.0–46.0)
HEMOGLOBIN: 8 g/dL — AB (ref 12.0–15.0)
MCH: 21 pg — AB (ref 26.0–34.0)
MCHC: 28.7 g/dL — AB (ref 30.0–36.0)
MCV: 73.2 fL — ABNORMAL LOW (ref 78.0–100.0)
Platelets: 285 10*3/uL (ref 150–400)
RBC: 3.81 MIL/uL — ABNORMAL LOW (ref 3.87–5.11)
RDW: 18.6 % — ABNORMAL HIGH (ref 11.5–15.5)
WBC: 4.4 10*3/uL (ref 4.0–10.5)

## 2016-01-17 LAB — I-STAT TROPONIN, ED: Troponin i, poc: 0.02 ng/mL (ref 0.00–0.08)

## 2016-01-17 NOTE — ED Notes (Addendum)
Pt has decided to leave AMA.  Pts family spoke with RN.

## 2016-01-17 NOTE — ED Notes (Signed)
Pt brought in by daughter. She reports pt lives at independent living apartment and EMS had come out to pts house because pt had trouble swallowing while she was eating. She was eating dinner but threw up here food. EMS tried to give her water but she had trouble swallowing that. She reports pain to center of her chest. Pt very hard of hearing. Airway intact and denies having food stuck in her throat.

## 2016-03-26 ENCOUNTER — Other Ambulatory Visit (HOSPITAL_COMMUNITY): Payer: Self-pay | Admitting: *Deleted

## 2016-03-27 ENCOUNTER — Ambulatory Visit (HOSPITAL_COMMUNITY)
Admission: RE | Admit: 2016-03-27 | Discharge: 2016-03-27 | Disposition: A | Discharge status: Home / Self Care | Payer: Medicare Other | Source: Ambulatory Visit | Attending: Internal Medicine | Admitting: Internal Medicine

## 2016-03-27 DIAGNOSIS — D649 Anemia, unspecified: Secondary | ICD-10-CM | POA: Insufficient documentation

## 2016-03-27 LAB — PREPARE RBC (CROSSMATCH)

## 2016-03-27 MED ORDER — FUROSEMIDE 10 MG/ML IJ SOLN
20.0000 mg | Freq: Once | INTRAMUSCULAR | Status: AC
  Administered 2016-03-27: 20 mg via INTRAVENOUS

## 2016-03-27 MED ORDER — FUROSEMIDE 10 MG/ML IJ SOLN
INTRAMUSCULAR | Status: AC
  Filled 2016-03-27: qty 2

## 2016-03-27 MED ORDER — SODIUM CHLORIDE 0.9 % IV SOLN
Freq: Once | INTRAVENOUS | Status: AC
Start: 2016-03-27 — End: 2016-03-27
  Administered 2016-03-27: 10:00:00 via INTRAVENOUS

## 2016-03-28 LAB — TYPE AND SCREEN
ABO/RH(D): O POS
ANTIBODY SCREEN: NEGATIVE
Unit division: 0
Unit division: 0

## 2016-05-21 ENCOUNTER — Emergency Department (HOSPITAL_COMMUNITY)
Admission: EM | Admit: 2016-05-21 | Discharge: 2016-05-21 | Disposition: A | Discharge status: Home / Self Care | Payer: Medicare Other | Attending: Emergency Medicine | Admitting: Emergency Medicine

## 2016-05-21 ENCOUNTER — Emergency Department (HOSPITAL_COMMUNITY): Payer: Medicare Other

## 2016-05-21 ENCOUNTER — Encounter (HOSPITAL_COMMUNITY): Payer: Self-pay

## 2016-05-21 DIAGNOSIS — R4182 Altered mental status, unspecified: Secondary | ICD-10-CM | POA: Diagnosis present

## 2016-05-21 DIAGNOSIS — M1990 Unspecified osteoarthritis, unspecified site: Secondary | ICD-10-CM | POA: Insufficient documentation

## 2016-05-21 DIAGNOSIS — Z7982 Long term (current) use of aspirin: Secondary | ICD-10-CM | POA: Diagnosis not present

## 2016-05-21 DIAGNOSIS — E860 Dehydration: Secondary | ICD-10-CM

## 2016-05-21 DIAGNOSIS — M545 Low back pain, unspecified: Secondary | ICD-10-CM

## 2016-05-21 DIAGNOSIS — R110 Nausea: Secondary | ICD-10-CM | POA: Diagnosis not present

## 2016-05-21 DIAGNOSIS — R5383 Other fatigue: Secondary | ICD-10-CM | POA: Insufficient documentation

## 2016-05-21 DIAGNOSIS — I2510 Atherosclerotic heart disease of native coronary artery without angina pectoris: Secondary | ICD-10-CM | POA: Insufficient documentation

## 2016-05-21 DIAGNOSIS — Z79899 Other long term (current) drug therapy: Secondary | ICD-10-CM | POA: Insufficient documentation

## 2016-05-21 DIAGNOSIS — W19XXXA Unspecified fall, initial encounter: Secondary | ICD-10-CM

## 2016-05-21 DIAGNOSIS — I10 Essential (primary) hypertension: Secondary | ICD-10-CM | POA: Insufficient documentation

## 2016-05-21 LAB — URINALYSIS, ROUTINE W REFLEX MICROSCOPIC
BILIRUBIN URINE: NEGATIVE
GLUCOSE, UA: NEGATIVE mg/dL
HGB URINE DIPSTICK: NEGATIVE
KETONES UR: NEGATIVE mg/dL
LEUKOCYTES UA: NEGATIVE
Nitrite: NEGATIVE
PROTEIN: NEGATIVE mg/dL
Specific Gravity, Urine: 1.016 (ref 1.005–1.030)
pH: 7 (ref 5.0–8.0)

## 2016-05-21 LAB — BASIC METABOLIC PANEL
Anion gap: 8 (ref 5–15)
BUN: 51 mg/dL — AB (ref 6–20)
CHLORIDE: 109 mmol/L (ref 101–111)
CO2: 23 mmol/L (ref 22–32)
CREATININE: 0.87 mg/dL (ref 0.44–1.00)
Calcium: 9.1 mg/dL (ref 8.9–10.3)
GFR calc Af Amer: >60 mL/min (ref >60)
GFR calc non Af Amer: 54 mL/min — ABNORMAL LOW (ref >60)
GLUCOSE: 114 mg/dL — AB (ref 65–99)
Potassium: 4.6 mmol/L (ref 3.5–5.1)
SODIUM: 140 mmol/L (ref 135–145)

## 2016-05-21 LAB — CBC
HCT: 25.3 % — ABNORMAL LOW (ref 36.0–46.0)
Hemoglobin: 7.8 g/dL — ABNORMAL LOW (ref 12.0–15.0)
MCH: 27.1 pg (ref 26.0–34.0)
MCHC: 30.8 g/dL (ref 30.0–36.0)
MCV: 87.8 fL (ref 78.0–100.0)
PLATELETS: 243 10*3/uL (ref 150–400)
RBC: 2.88 MIL/uL — ABNORMAL LOW (ref 3.87–5.11)
RDW: 21.5 % — AB (ref 11.5–15.5)
WBC: 7.3 10*3/uL (ref 4.0–10.5)

## 2016-05-21 LAB — CBG MONITORING, ED: Glucose-Capillary: 119 mg/dL — ABNORMAL HIGH (ref 65–99)

## 2016-05-21 MED ORDER — SODIUM CHLORIDE 0.9 % IV BOLUS (SEPSIS)
500.0000 mL | Freq: Once | INTRAVENOUS | Status: AC
  Administered 2016-05-21: 500 mL via INTRAVENOUS

## 2016-05-21 NOTE — ED Notes (Signed)
Accidentally Clicked off [place]ine Lock - Pt still need IV

## 2016-05-21 NOTE — ED Provider Notes (Signed)
CSN: [id number]     Arrival date & time 05/21/16  1425 History   First MD Initiated Contact with Patient 05/21/16 1633     Chief Complaint  Patient presents with  . Fall  . Altered Mental Status      Patient is a 82 y.o. female presenting with fall and altered mental status. The history is provided by the patient.  Fall Pertinent negatives include no chest pain, no abdominal pain, no headaches and no shortness of breath.  Altered Mental Status Associated symptoms: light-headedness and nausea   Associated symptoms: no abdominal pain, no headaches, no rash, no vomiting and no weakness   Patient had a syncopal episode. She was in the bathroom and became diaphoretic and passed out. Fell hitting her head. Has low back pain and slight headache. Chronic back pain. Apparently his been a little more fatigued last few days. No fevers or chills. No blood in the stool. She has a chronically decreased appetite. No localizing numbness or weakness. Past Medical History  Diagnosis Date  . Hypertension   . Hearing loss     "both ears"  . Vision loss, bilateral   . High cholesterol   . Heart murmur   . Anginal pain (HCC)   . Coronary artery disease   . GERD (gastroesophageal reflux disease)   . History of hiatal hernia   . Headache     "weekly" (12/26/2014)  . Arthritis     "all over" (12/26/2014)  . Chronic lower back pain   . Kidney stone     Patient has distention on the right side from scar tissue from a past embedded kidney stone.   . Trichiasis     "of lashes; gets this done q 3 months or so" (12/26/2014)  . HCAP (healthcare-associated pneumonia) 12/26/2014   Past Surgical History  Procedure Laterality Date  . Kidney stone surgery  1970's    "imbedded stone; had to cut her to get it out"  . Coronary angioplasty with stent placement  ~ 1998; ~ 2002    "1; 1"   . Dilation and curettage of uterus    . Entropian repair Right 01/01/2004    "lower eyelid"  . Ectropion repair Left 11/06/03     "lower eyelid"  . Entropian repair Bilateral     "lower"  . Punctoplasty Bilateral     "lower eyelids"  . Cataract extraction w/ intraocular lens implant Bilateral 2000's  . Eye surgery Bilateral 2000's    "to prevent lashes growing inward; had upper and lower on one side; upper only on the other; has to have eyelashes plucked q 3 months or so" (12/26/2014(  . Eye surgery Bilateral     repair retractor  . Hip pinning,cannulated Right 05/20/2015    Procedure: CANNULATED HIP PINNING;  Surgeon: Eldred Manges, MD;  Location: [facility] OR;  Service: Orthopedics;  Laterality: Right;   History reviewed. No pertinent family history. Social History  Substance Use Topics  . Smoking status: Never Smoker   . Smokeless tobacco: Never Used  . Alcohol Use: No   OB History    No data available     Review of Systems  Constitutional: Positive for diaphoresis, appetite change and fatigue. Negative for activity change.  HENT: Negative for trouble swallowing.   Eyes: Negative for pain.  Respiratory: Negative for chest tightness and shortness of breath.   Cardiovascular: Negative for chest pain and leg swelling.  Gastrointestinal: Positive for nausea. Negative for vomiting, abdominal pain and  diarrhea.  Genitourinary: Negative for flank pain.  Musculoskeletal: Positive for back pain. Negative for neck stiffness.  Skin: Negative for rash and wound.  Neurological: Positive for light-headedness. Negative for weakness, numbness and headaches.  Psychiatric/Behavioral: Negative for behavioral problems.      Allergies  Codeine and Novocain  Home Medications   Prior to Admission medications   Medication Sig Start Date End Date Taking? Authorizing Provider  acetaminophen (TYLENOL) 325 MG tablet Take 650 mg by mouth 2 (two) times daily.    Historical Provider, MD  ALPRAZolam Prudy Feeler) 0.25 MG tablet Take 1 tablet (0.25 mg total) by mouth daily as needed for sleep or anxiety. Patient taking differently: Take  0.25 mg by mouth at bedtime.  02/21/13   Geoffry Paradise, MD  Artificial Tear Ointment (ARTIFICIAL TEARS) ointment Place 1 application into both eyes 4 (four) times daily.     Historical Provider, MD  Artificial Tear Ointment (REFRESH P.M. OP) Apply 1 application to eye at bedtime.    Historical Provider, MD  aspirin EC 81 MG EC tablet Take 1 tablet (81 mg total) by mouth daily. 02/21/13   Geoffry Paradise, MD  aspirin EC 81 MG tablet Take 1 tablet (81 mg total) by mouth daily. 05/22/15   Naida Sleight, PA-C  calcitonin, salmon, (MIACALCIN/FORTICAL) 200 UNIT/ACT nasal spray Place 1 spray into the nose daily. Patient not taking: Reported on 12/26/2014 02/21/13   Geoffry Paradise, MD  Cholecalciferol 1000 UNITS tablet Take 1,000 Units by mouth daily.    Historical Provider, MD  docusate sodium (COLACE) 100 MG capsule Take 100 mg by mouth at bedtime.     Historical Provider, MD  escitalopram (LEXAPRO) 10 MG tablet Take 10 mg by mouth daily. 10/13/14   Historical Provider, MD  HORSE CHESTNUT PO Take 1 capsule by mouth at bedtime. For leg pain    Historical Provider, MD  isosorbide mononitrate (IMDUR) 30 MG 24 hr tablet Take 30 mg by mouth daily.    Historical Provider, MD  losartan (COZAAR) 50 MG tablet Take 50 mg by mouth daily. 09/22/14   Historical Provider, MD  omeprazole (PRILOSEC) 40 MG capsule Take 40 mg by mouth daily.    Historical Provider, MD  polyethylene glycol (MIRALAX / GLYCOLAX) packet Take 17 g by mouth daily as needed. 11/10/14   Joseph Art, DO  prednisoLONE acetate (PRED FORTE) 1 % ophthalmic suspension Place 1 drop into both eyes daily.    Historical Provider, MD  simethicone (MYLICON) 125 MG chewable tablet Chew 125 mg by mouth 2 (two) times daily.    Historical Provider, MD  traMADol (ULTRAM) 50 MG tablet Take 1 tablet (50 mg total) by mouth every 6 (six) hours as needed. 05/22/15   Naida Sleight, PA-C   BP 106/45 mmHg  Pulse 77  Temp(Src) 98.3 F (36.8 C) (Oral)  Resp 17  SpO2  99% Physical Exam  Constitutional: She appears well-developed.  HENT:  Mild tenderness to occipital area.  Eyes: Pupils are equal, round, and reactive to light.  Neck: Neck supple.  Cardiovascular: Normal rate.   Pulmonary/Chest: Effort normal.  Abdominal: Soft. There is tenderness.  Left-sided lower abdominal tenderness, reportedly chronic per daughter.  Musculoskeletal: She exhibits tenderness.  Tenderness to bilateral lower extremities, chronic per daughter. Some tenderness over lumbar spine also.  Neurological: She is alert.  Skin: Skin is warm.    ED Course  Procedures (including critical care time) Labs Review Labs Reviewed  BASIC METABOLIC PANEL - Abnormal; Notable for  the following:    Glucose, Bld 114 (*)    BUN 51 (*)    GFR calc non Af Amer 54 (*)    All other components within normal limits  CBC - Abnormal; Notable for the following:    RBC 2.88 (*)    Hemoglobin 7.8 (*)    HCT 25.3 (*)    RDW 21.5 (*)    All other components within normal limits  CBG MONITORING, ED - Abnormal; Notable for the following:    Glucose-Capillary 119 (*)    All other components within normal limits  URINALYSIS, ROUTINE W REFLEX MICROSCOPIC (NOT AT [facility])    Imaging Review Dg Chest 2 View  05/21/2016  CLINICAL DATA:  Fall and syncope. Fell from toilet. Sudden onset of diaphoresis. EXAM: CHEST  2 VIEW COMPARISON:  Two-view chest x-ray 01/17/2016 FINDINGS: Cardiomegaly is stable. Atherosclerotic calcifications are again noted at the aortic arch and in the descending thoracic aorta. Emphysematous changes are again noted. There is no edema or effusion to suggest failure. Coronary artery calcifications are evident. No focal airspace disease present. Exaggerated thoracic kyphosis is stable. No acute fractures are present. IMPRESSION: 1. Stable cardiomegaly without failure. 2. Atherosclerosis. 3. Emphysema. Electronically Signed   By: Marin Roberts M.D.   On: 05/21/2016 17:38   Dg  Lumbar Spine Complete  05/21/2016  CLINICAL DATA:  Fall from toilet.  Pain. EXAM: LUMBAR SPINE - COMPLETE 4+ VIEW COMPARISON:  CT abdomen and pelvis 12/26/2014. FINDINGS: Levoconvex curvature of the lumbar spine is centered at L2-3. Asymmetric endplate changes are noted on the right at T12-L1. Marked osteopenia is present. There are vacuum discs at multiple levels. Atherosclerotic calcifications are present in the aorta without aneurysm. No acute fracture or traumatic subluxation is evident. IMPRESSION: 1. No acute abnormality. 2. Stable scoliosis and multilevel spondylosis. Electronically Signed   By: Marin Roberts M.D.   On: 05/21/2016 17:40   Ct Head Wo Contrast  05/21/2016  CLINICAL DATA:  Status post fall, syncope EXAM: CT HEAD WITHOUT CONTRAST TECHNIQUE: Contiguous axial images were obtained from the base of the skull through the vertex without intravenous contrast. COMPARISON:  01/07/2004 FINDINGS: Brain: No evidence of acute infarction, hemorrhage, extra-axial collection, ventriculomegaly, or mass effect. Generalized cerebral atrophy. Periventricular white matter low attenuation likely secondary to microangiopathy. Vascular: Cerebrovascular atherosclerotic calcifications are noted. Skull: Negative for fracture or focal lesion. Sinuses/Orbits: Visualized portions of the orbits are unremarkable. Visualized portions of the paranasal sinuses and mastoid air cells are unremarkable. Other: None. IMPRESSION: No acute intracranial pathology. Electronically Signed   By: Elige Ko   On: 05/21/2016 17:45   I have personally reviewed and evaluated these images and lab results as part of my medical decision-making.   EKG Interpretation   Date/Time:  Wednesday May 21 2016 14:57:18 EDT Ventricular Rate:  78 PR Interval:    QRS Duration: 93 QT Interval:  377 QTC Calculation: 430 R Axis:   -47 Text Interpretation:  Sinus rhythm Atrial premature complex LAD, consider  left anterior fascicular  block Nonspecific T abnormalities, anterior leads  since last tracing no significant change Confirmed by Effie Shy  MD, Mechele Collin  ([id number]) on 05/21/2016 3:20:00 PM      MDM   Final diagnoses:  Fall, initial encounter  Midline low back pain without sciatica  Dehydration    Patient with fall and had diaphoresis. May have been a vagal event. Has some dehydration evident from elevated BUN. Lab work otherwise reassuring. Imaging reassuring. Discussed with Dr.  Jacky KindleAronson, who was familiar with the patient. The anemia that she has is near her baseline. Would not transfuse at this time. Will discharge home.    Benjiman CoreNathan Keyonna [place], MD 05/21/16 [phone]

## 2016-05-21 NOTE — Discharge Instructions (Signed)
Back Pain, Adult °Back pain is very common in adults. The cause of back pain is rarely dangerous and the pain often gets better over time. The cause of your back pain may not be known. Some common causes of back pain include: °· Strain of the muscles or ligaments supporting the spine. °· Wear and tear (degeneration) of the spinal disks. °· Arthritis. °· Direct injury to the back. °For many people, back pain may return. Since back pain is rarely dangerous, most people can learn to manage this condition on their own. °HOME CARE INSTRUCTIONS °Watch your back pain for any changes. The following actions may help to lessen any discomfort you are feeling: °· Remain active. It is stressful on your back to sit or stand in one place for long periods of time. Do not sit, drive, or stand in one place for more than 30 minutes at a time. Take short walks on even surfaces as soon as you are able. Try to increase the length of time you walk each day. °· Exercise regularly as directed by your health care provider. Exercise helps your back heal faster. It also helps avoid future injury by keeping your muscles strong and flexible. °· Do not stay in bed. Resting more than 1-2 days can delay your recovery. °· Pay attention to your body when you bend and lift. The most comfortable positions are those that put less stress on your recovering back. Always use proper lifting techniques, including: °· Bending your knees. °· Keeping the load close to your body. °· Avoiding twisting. °· Find a comfortable position to sleep. Use a firm mattress and lie on your side with your knees slightly bent. If you lie on your back, put a pillow under your knees. °· Avoid feeling anxious or stressed. Stress increases muscle tension and can worsen back pain. It is important to recognize when you are anxious or stressed and learn ways to manage it, such as with exercise. °· Take medicines only as directed by your health care provider. Over-the-counter  medicines to reduce pain and inflammation are often the most helpful. Your health care provider may prescribe muscle relaxant drugs. These medicines help dull your pain so you can more quickly return to your normal activities and healthy exercise. °· Apply ice to the injured area: °· Put ice in a plastic bag. °· Place a towel between your skin and the bag. °· Leave the ice on for 20 minutes, 2-3 times a day for the first 2-3 days. After that, ice and heat may be alternated to reduce pain and spasms. °· Maintain a healthy weight. Excess weight puts extra stress on your back and makes it difficult to maintain good posture. °SEEK MEDICAL CARE IF: °· You have pain that is not relieved with rest or medicine. °· You have increasing pain going down into the legs or buttocks. °· You have pain that does not improve in one week. °· You have night pain. °· You lose weight. °· You have a fever or chills. °SEEK IMMEDIATE MEDICAL CARE IF:  °· You develop new bowel or bladder control problems. °· You have unusual weakness or numbness in your arms or legs. °· You develop nausea or vomiting. °· You develop abdominal pain. °· You feel faint. °  °This information is not intended to replace advice given to you by your health care provider. Make sure you discuss any questions you have with your health care provider. °  °Document Released: 10/27/2005 Document Revised: 11/17/2014 Document Reviewed: 02/28/2014 °Elsevier Interactive Patient Education ©2016 [employer]  Dehydration Dehydration is when you lose more fluids from the body than you take in. Vital organs such as the kidneys, brain, and heart cannot function without a proper amount of fluids and salt. Any loss of fluids from the body can cause dehydration.  Older adults are at a higher risk of dehydration than younger adults. As we age, our bodies are less able to conserve water and do not respond to temperature changes as well. Also, older adults do not become thirsty as  easily or quickly. Because of this, older adults often do not realize they need to increase fluids to avoid dehydration.  CAUSES   Vomiting.  Diarrhea.  Excessive sweating.  Excessive urination.  Fever.  Certain medicines, such as blood pressure medicines called diuretics.  Poorly controlled blood sugars. SIGNS AND SYMPTOMS  Mild dehydration:  Thirst.  Dry lips.  Slightly dry mouth. Moderate dehydration:  Very dry mouth.  Sunken eyes.  Skin does not bounce back quickly when lightly pinched and released.  Dark urine and decreased urine production.  Decreased tear production.  Headache. Severe dehydration:  Very dry mouth.  Extreme thirst.  Rapid, weak pulse (more than 100 beats per minute at rest).  Cold hands and feet.  Not able to sweat in spite of heat.  Rapid breathing.  Blue lips.  Confusion and lethargy.  Difficulty being awakened.  Minimal urine production.  No tears. DIAGNOSIS  Your health care provider will diagnose dehydration based on your symptoms and your exam. Blood and urine tests will help confirm the diagnosis. The diagnostic evaluation should also identify the cause of dehydration. TREATMENT  Treatment of mild or moderate dehydration can often be done at home by increasing the amount of fluids that you drink. It is best to drink small amounts of fluid more often. Drinking too much at one time can make vomiting worse. Severe dehydration needs to be treated at the hospital. You may be given IV fluids that contain water and electrolytes. HOME CARE INSTRUCTIONS   Ask your health care provider about specific rehydration instructions.  Drink enough fluids to keep your urine clear or pale yellow.  Drink small amounts frequently if you have nausea and vomiting.  Eat as you normally do.  Avoid:  Foods or drinks high in sugar.  Carbonated drinks.  Juice.  Extremely hot or cold fluids.  Drinks with caffeine.  Fatty, greasy  foods.  Alcohol.  Tobacco.  Overeating.  Gelatin desserts.  Wash your hands well to avoid spreading bacteria and viruses.  Only take over-the-counter or prescription medicines for pain, discomfort, or fever as directed by your health care provider.  Ask your health care provider if you should continue all prescribed and over-the-counter medicines.  Keep all follow-up appointments with your health care provider. SEEK MEDICAL CARE IF:  You have abdominal pain, and it increases or stays in one area (localizes).  You have a rash, stiff neck, or severe headache.  You are irritable, sleepy, or difficult to awaken.  You are weak, dizzy, or extremely thirsty.  You have a fever. SEEK IMMEDIATE MEDICAL CARE IF:   You are unable to keep fluids down, or you get worse despite treatment.  You have frequent episodes of vomiting or diarrhea.  You have blood or green matter (bile) in your vomit.  You have blood in your stool, or your stool looks black and tarry.  You have not urinated in 6-8 hours, or you have only urinated a small amount of very  dark urine.  You faint. MAKE SURE YOU:   Understand these instructions.  Will watch your condition.  Will get help right away if you are not doing well or get worse.   This information is not intended to replace advice given to you by your health care provider. Make sure you discuss any questions you have with your health care provider.   Document Released: 01/17/2004 Document Revised: 11/01/2013 Document Reviewed: 07/04/2013 Elsevier Interactive Patient Education [employer]2016 [employer] Inc[employer]

## 2016-05-21 NOTE — ED Notes (Addendum)
Per EMS, Pt, from [facility], presents after becoming diaphoretic and falling off from the toilet this afternoon.  Denies pain.  EMS reports "she has a lot of complaints, but nothing is new per family."  Pt denies LOC.      Pt's daughter reports increased confusion x 4 days.

## 2016-06-04 IMAGING — CR DG CHEST 1V PORT
1 series · 1 of 1 positions shown · non-contrast
Comparison: 12/26/2014

CLINICAL DATA: Preop

EXAM:
PORTABLE CHEST - 1 VIEW

[AP]
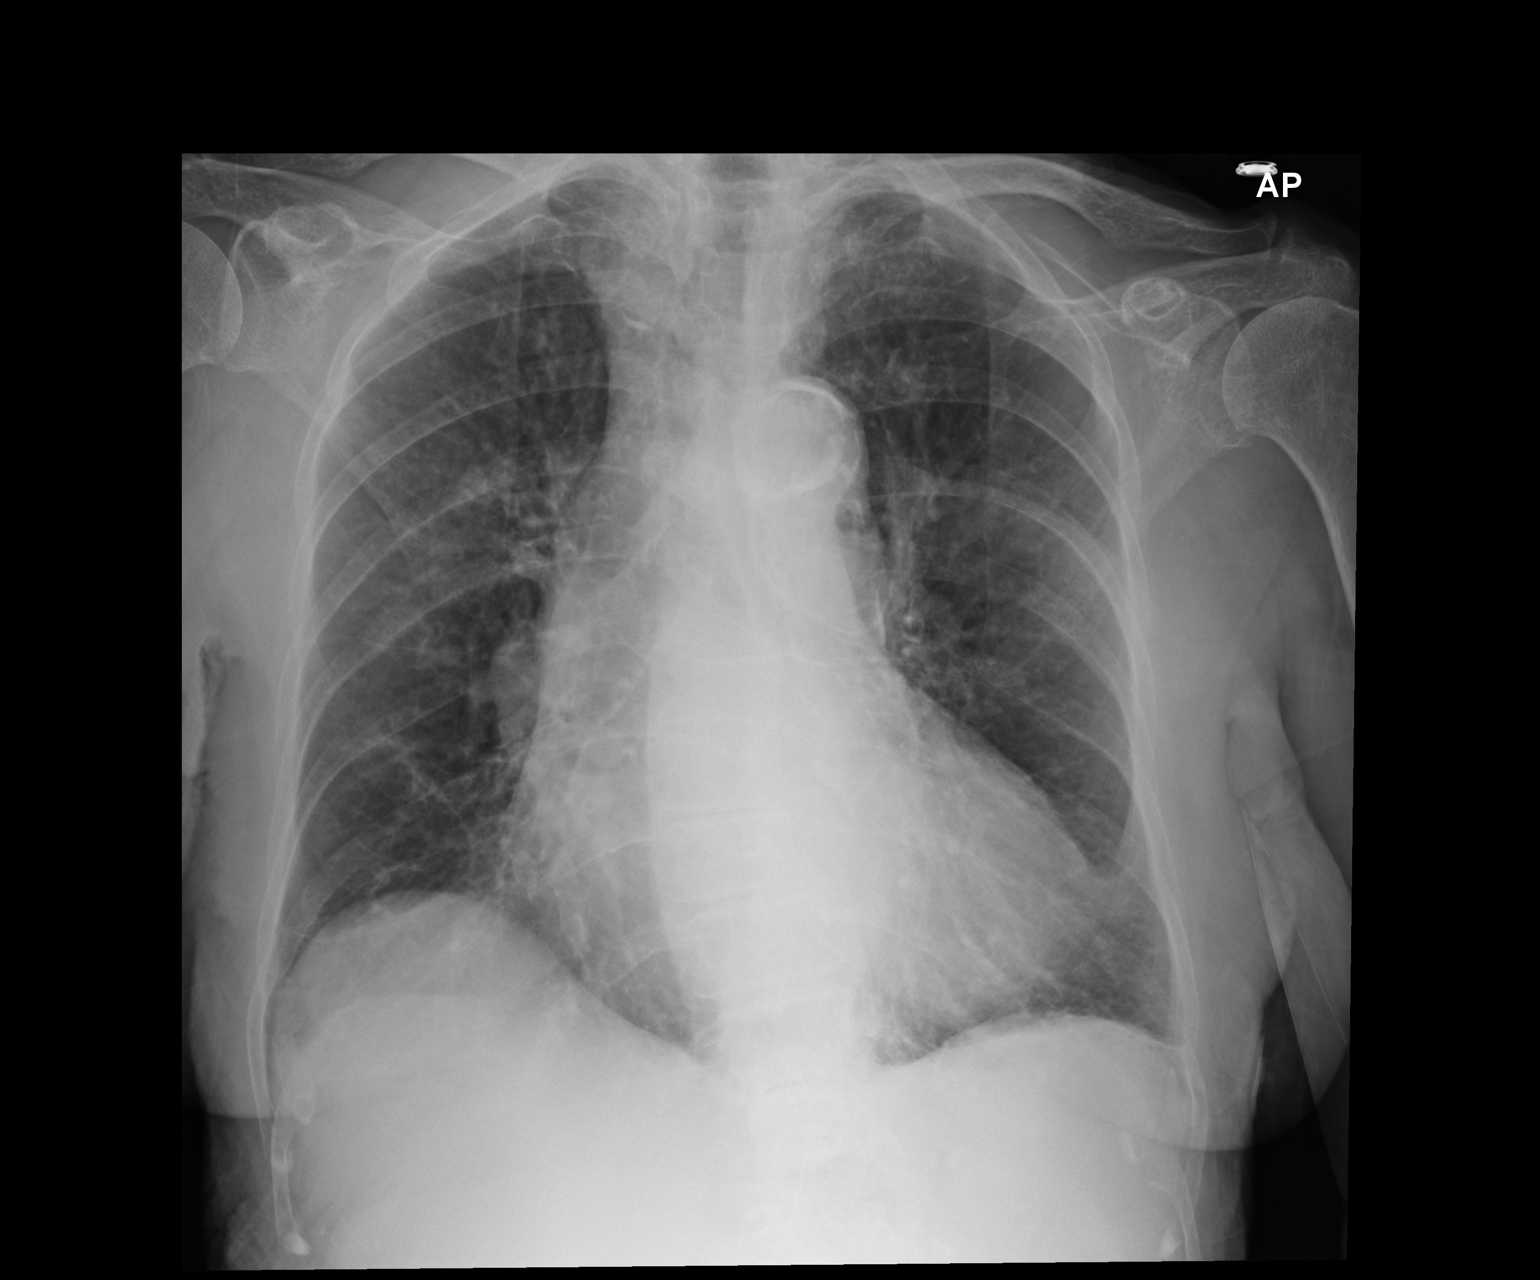

[1 of 1 positions shown; findings below may reference images not displayed]

FINDINGS: Chronic interstitial markings. No focal consolidation. No pleural
effusion or pneumothorax.

Cardiomegaly.
IMPRESSION: No evidence of acute cardiopulmonary disease.

## 2016-07-11 DEATH — deceased

## 2017-06-06 IMAGING — CT CT HEAD W/O CM
3 of 4 series · 15 of 47 positions shown, 18 images · non-contrast
Comparison: 01/07/2004

CLINICAL DATA: Status post fall, syncope

EXAM:
CT HEAD WITHOUT CONTRAST
TECHNIQUE: Contiguous axial images were obtained from the base of the skull
through the vertex without intravenous contrast.

[Series 2: head w/o · axial · non-contrast · 0.45mm/px · z∈[+1499,+1609]mm · 9 of 28 slices shown, 12 images]
[im 3/28  brain]
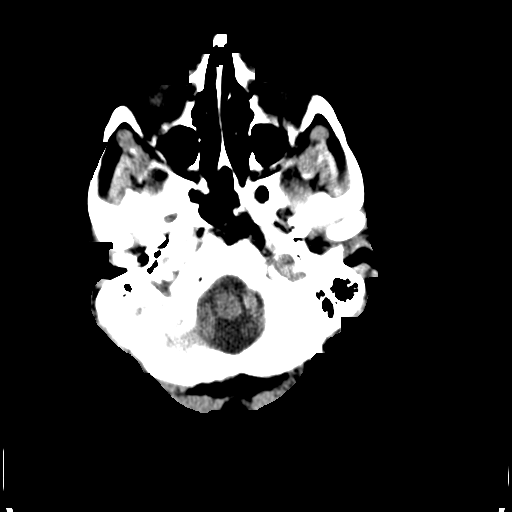
[im 3/28  bone]
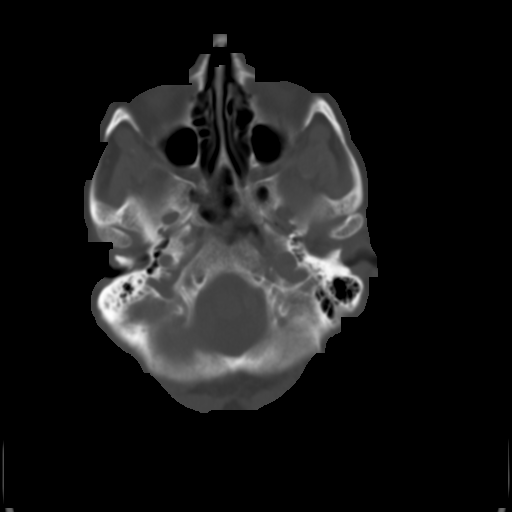
[im 6/28  brain]
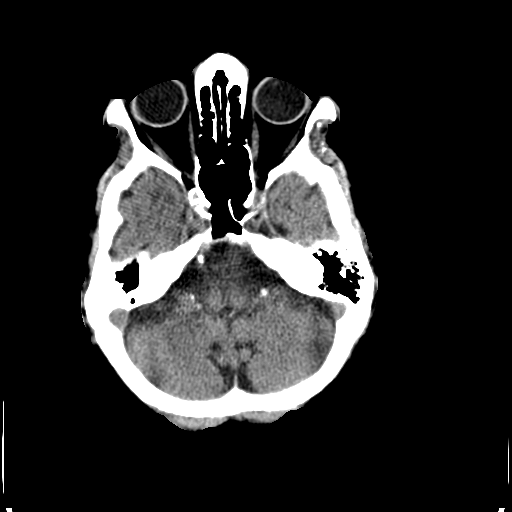
[im 9/28  brain]
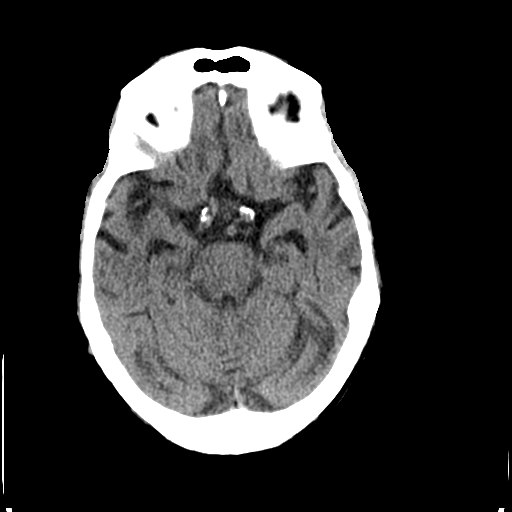
[im 11/28  brain]
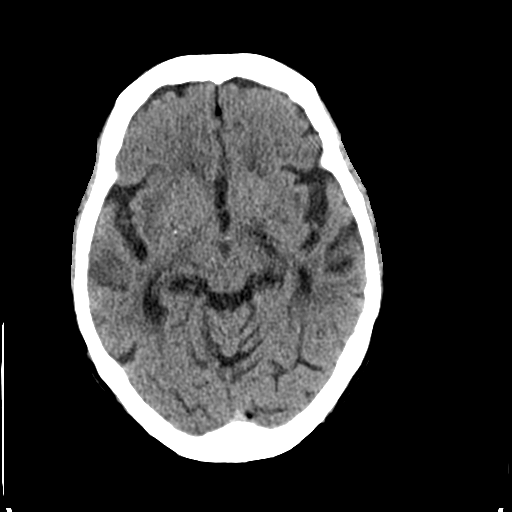
[im 14/28  brain]
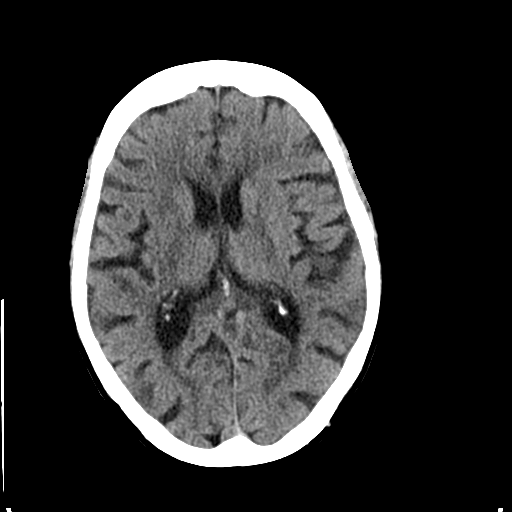
[im 14/28  bone]
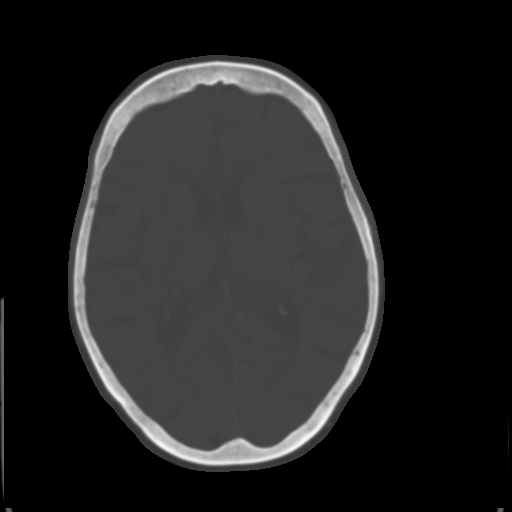
[im 17/28  brain]
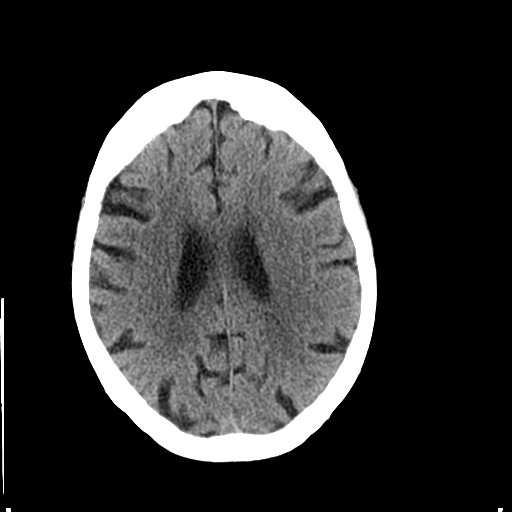
[im 19/28  brain]
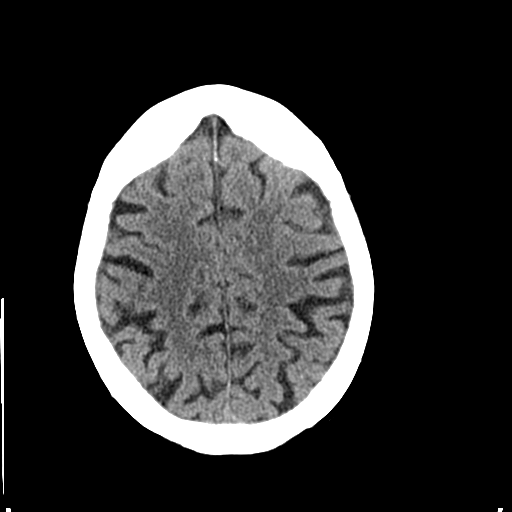
[im 22/28  brain]
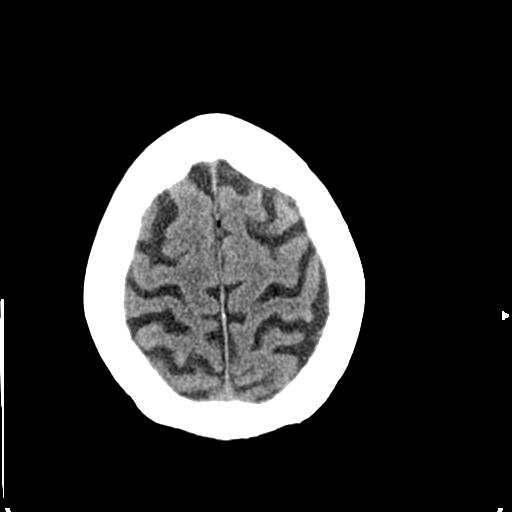
[im 25/28  brain]
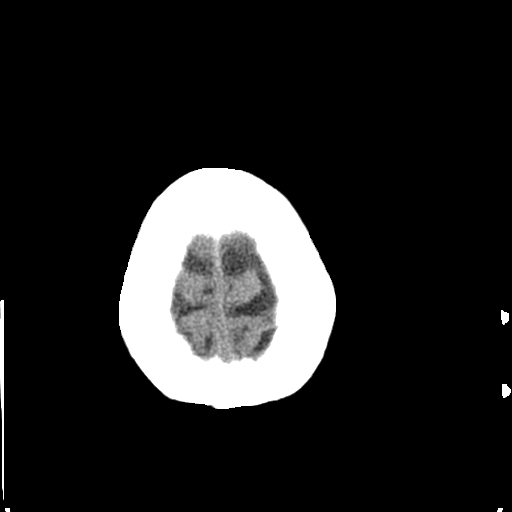
[im 25/28  bone]
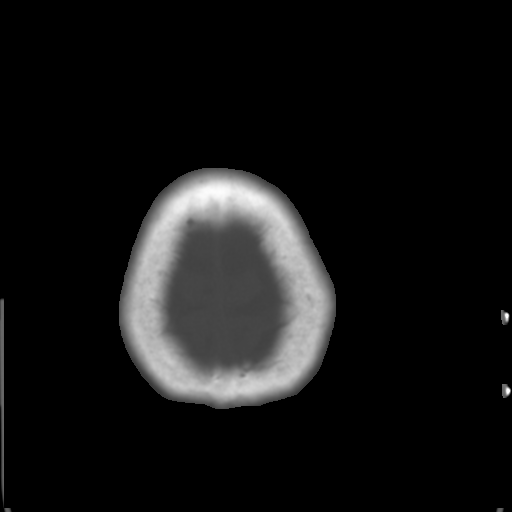

[Series 4: coronal · coronal · 0.31mm/px · 3 of 63 slices shown]
[im 21/63  brain]
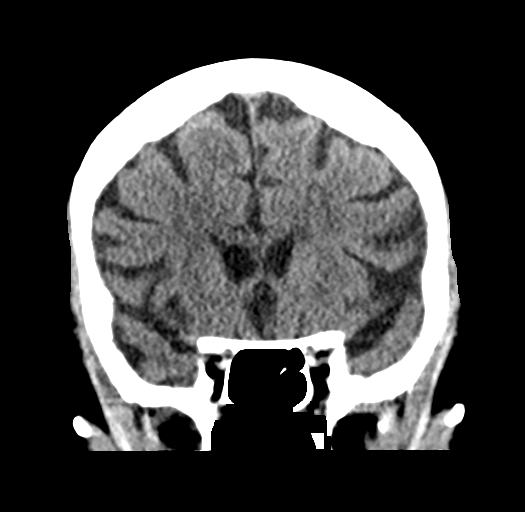
[im 28/63  brain]
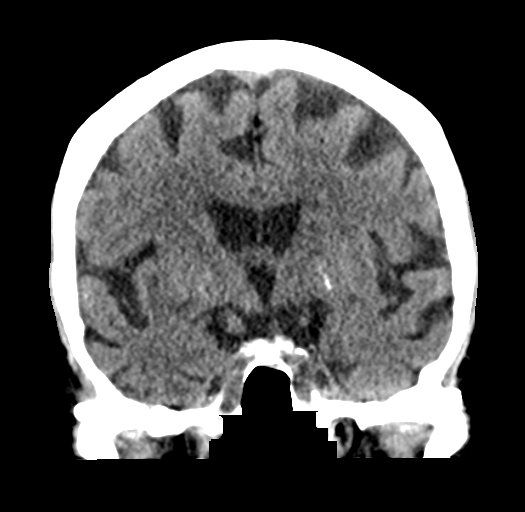
[im 35/63  brain]
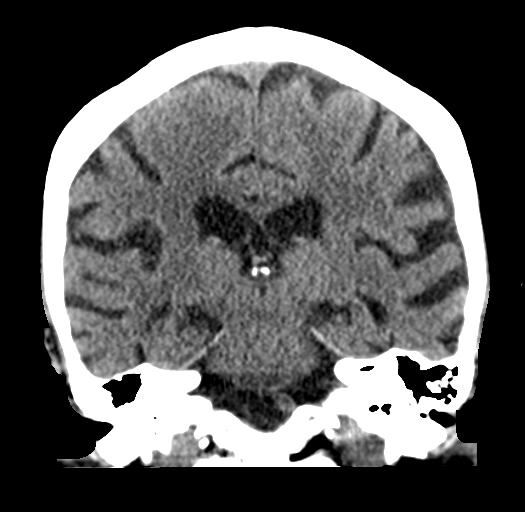

[Series 5: sagittal · sagittal · 0.33mm/px · 3 of 48 slices shown]
[im 16/48  brain]
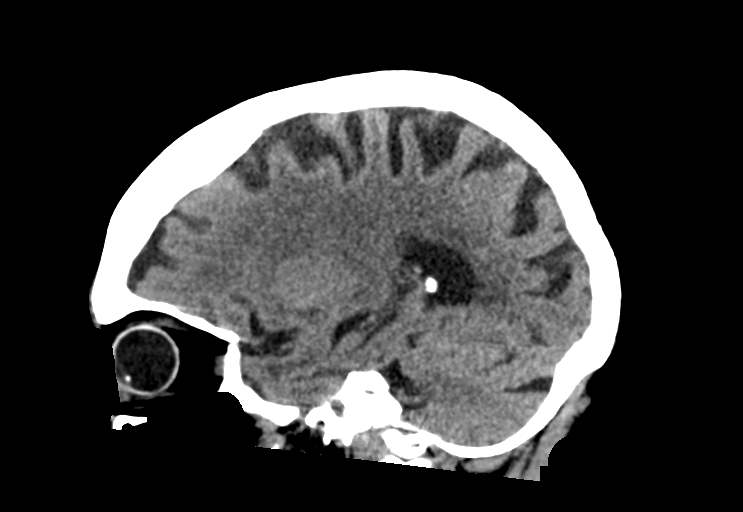
[im 24/48  brain]
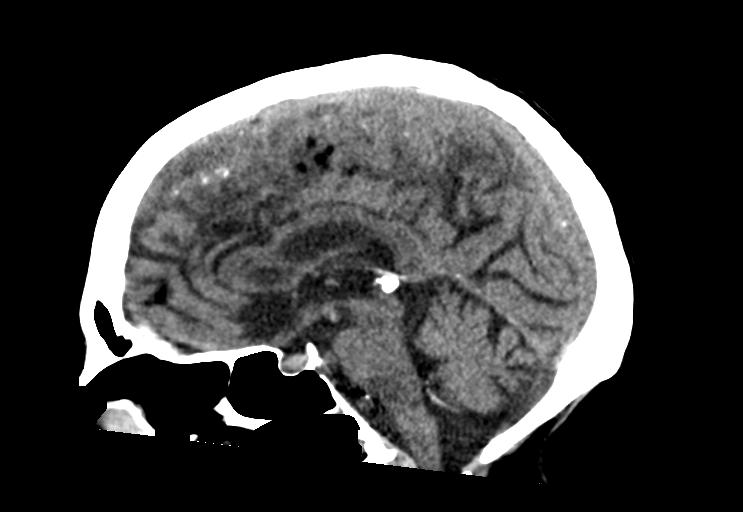
[im 32/48  brain]
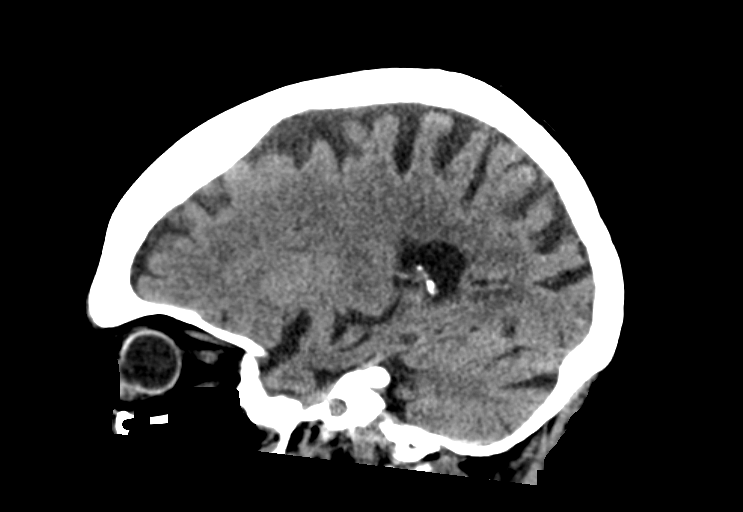

[15 of 47 positions shown; findings below may reference images not displayed]

FINDINGS: Brain: No evidence of acute infarction, hemorrhage, extra-axial
collection, ventriculomegaly, or mass effect. Generalized cerebral
atrophy. Periventricular white matter low attenuation likely
secondary to microangiopathy.

Vascular: Cerebrovascular atherosclerotic calcifications are noted.

Skull: Negative for fracture or focal lesion.

Sinuses/Orbits: Visualized portions of the orbits are unremarkable.
Visualized portions of the paranasal sinuses and mastoid air cells
are unremarkable.

Other: None.
IMPRESSION: No acute intracranial pathology.

## 2017-06-06 IMAGING — CR DG LUMBAR SPINE COMPLETE 4+V
5 series · 5 of 5 positions shown · non-contrast
Comparison: CT abdomen and pelvis 12/26/2014.

CLINICAL DATA: Fall from toilet.  Pain.

EXAM:
LUMBAR SPINE - COMPLETE 4+ VIEW

[t lumbar spine obl (1 of 3)]
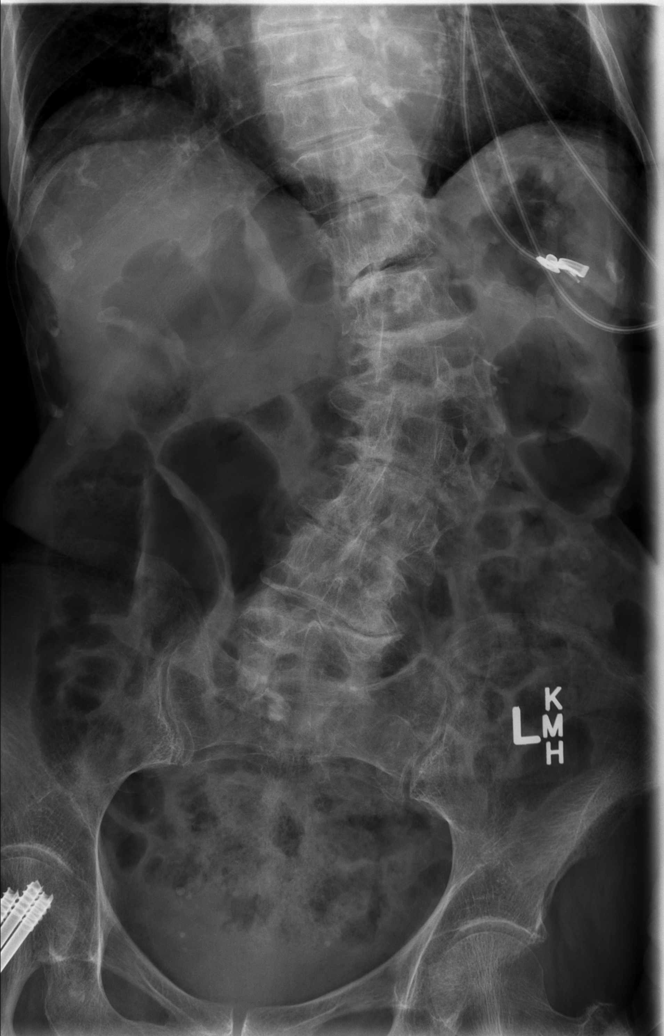

[t lumbar spine obl (2 of 3)]
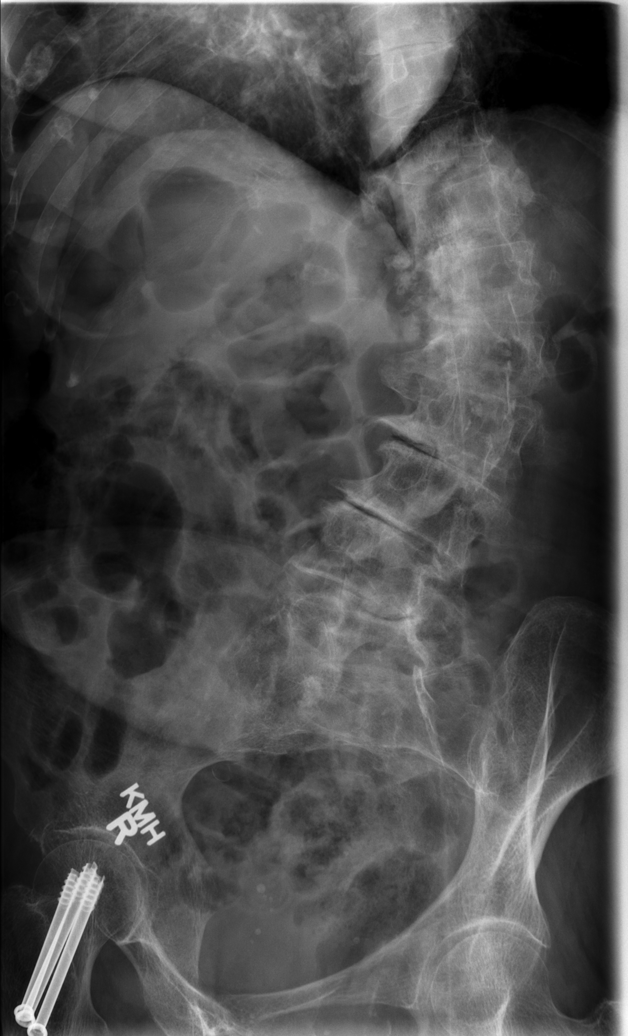

[t lumbar spine obl (3 of 3)]
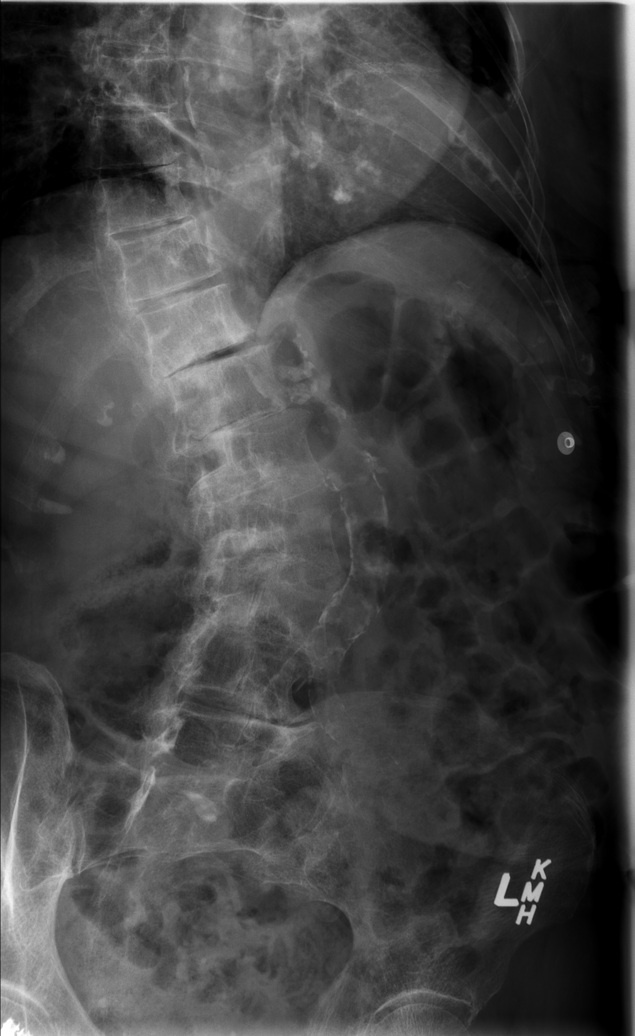

[t lumbar spine lat]
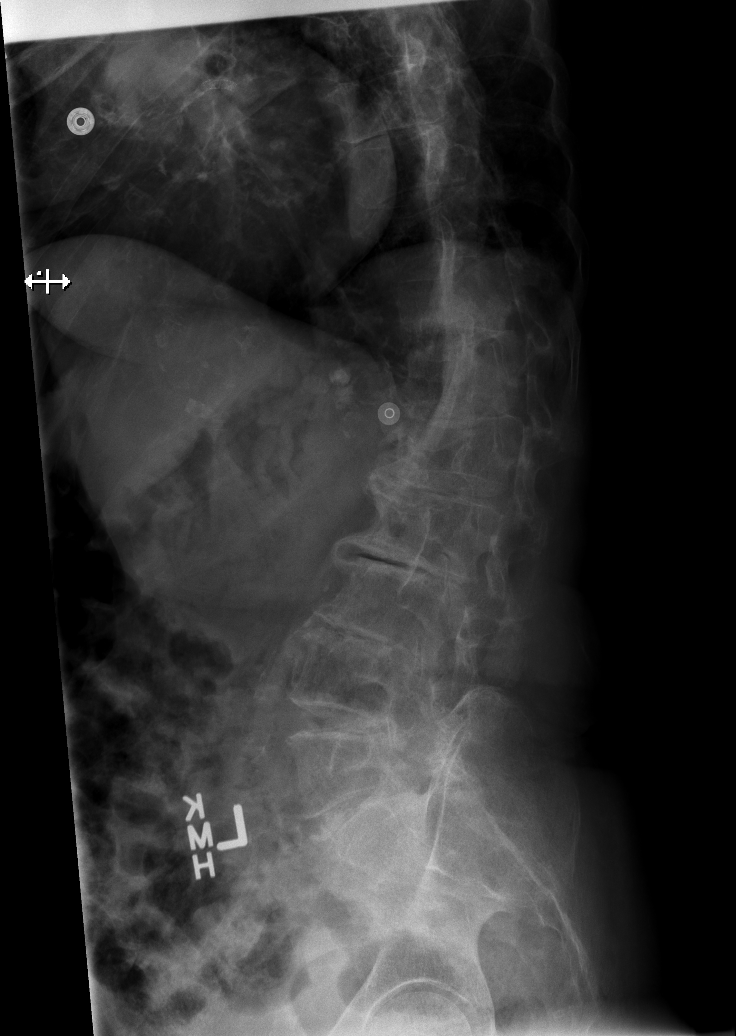

[t lumbar l-5 s-1 spot]
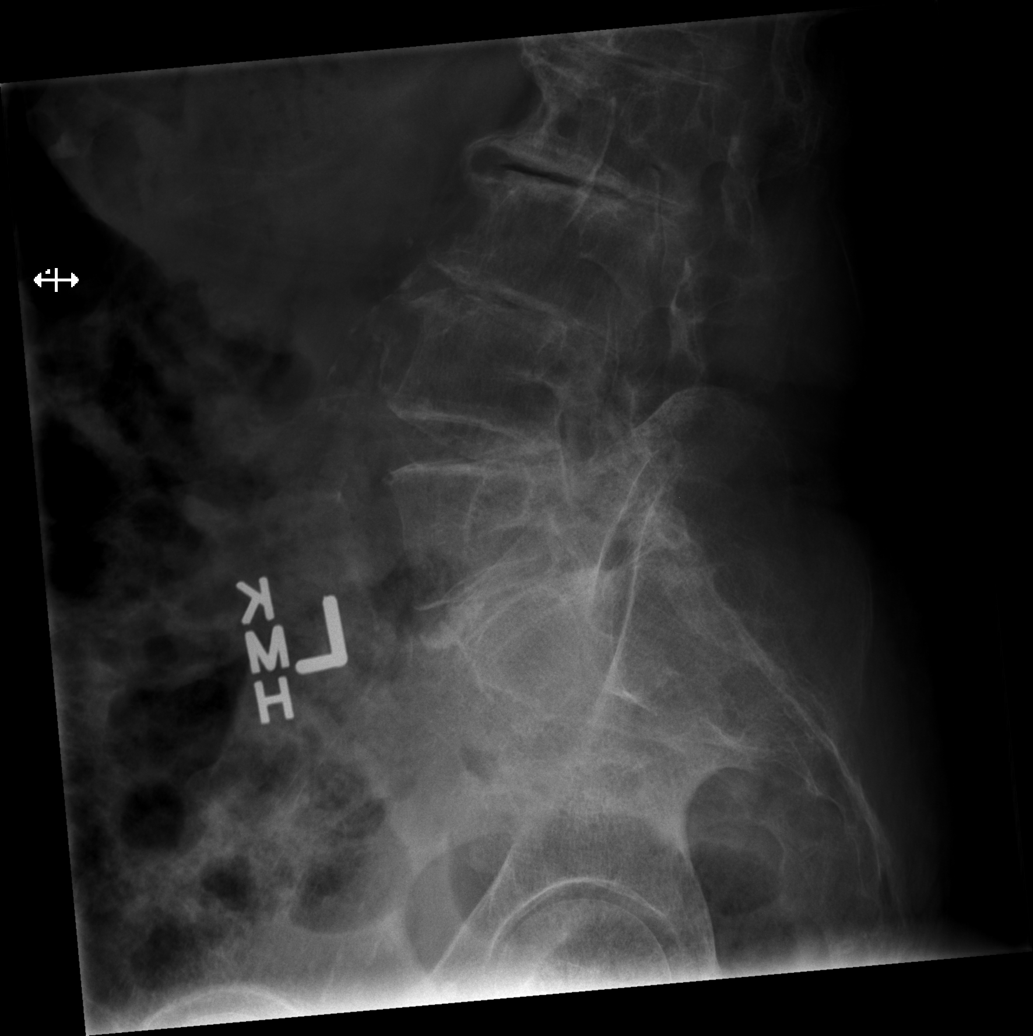

[5 of 5 positions shown; findings below may reference images not displayed]

FINDINGS: Levoconvex curvature of the lumbar spine is centered at L2-3.
Asymmetric endplate changes are noted on the right at T12-L1. Marked
osteopenia is present. There are vacuum discs at multiple levels.
Atherosclerotic calcifications are present in the aorta without
aneurysm.

No acute fracture or traumatic subluxation is evident.
IMPRESSION: 1. No acute abnormality.
2. Stable scoliosis and multilevel spondylosis.
